# Patient Record
Sex: Female | Born: 1985 | Race: Black or African American | Hispanic: No | Marital: Single | Smoking: Former smoker
Health system: Southern US, Community
[De-identification: ages and names within clinical notes are randomized; demographics above are authoritative.]

## PROBLEM LIST (undated history)

## (undated) DIAGNOSIS — J45909 Unspecified asthma, uncomplicated: Secondary | ICD-10-CM

## (undated) DIAGNOSIS — J040 Acute laryngitis: Secondary | ICD-10-CM

## (undated) HISTORY — PX: TUBAL LIGATION: SHX77

---

## 2007-06-20 ENCOUNTER — Emergency Department: Payer: Self-pay | Admitting: Emergency Medicine

## 2007-06-25 ENCOUNTER — Emergency Department: Payer: Self-pay | Admitting: Emergency Medicine

## 2007-07-20 ENCOUNTER — Emergency Department: Payer: Self-pay | Admitting: Emergency Medicine

## 2008-01-03 ENCOUNTER — Observation Stay: Payer: Self-pay | Admitting: Unknown Physician Specialty

## 2008-02-01 ENCOUNTER — Ambulatory Visit: Payer: Self-pay | Admitting: Obstetrics & Gynecology

## 2008-02-02 ENCOUNTER — Inpatient Hospital Stay: Payer: Self-pay | Admitting: Obstetrics & Gynecology

## 2008-04-18 ENCOUNTER — Ambulatory Visit: Payer: Self-pay | Admitting: Family Medicine

## 2011-09-02 ENCOUNTER — Ambulatory Visit: Payer: Self-pay

## 2012-12-20 ENCOUNTER — Ambulatory Visit: Payer: Self-pay | Admitting: Obstetrics and Gynecology

## 2013-02-14 ENCOUNTER — Observation Stay: Payer: Self-pay | Admitting: Obstetrics and Gynecology

## 2013-02-14 LAB — URINALYSIS, COMPLETE
Bilirubin,UR: NEGATIVE
Blood: NEGATIVE
Leukocyte Esterase: NEGATIVE
Nitrite: NEGATIVE
Ph: 7 (ref 4.5–8.0)
Protein: NEGATIVE
WBC UR: 1 /HPF (ref 0–5)

## 2013-03-23 ENCOUNTER — Observation Stay: Payer: Self-pay | Admitting: Obstetrics and Gynecology

## 2013-03-23 LAB — URINALYSIS, COMPLETE
Bilirubin,UR: NEGATIVE
Glucose,UR: NEGATIVE mg/dL (ref 0–75)
Ketone: NEGATIVE
Leukocyte Esterase: NEGATIVE
Ph: 8 (ref 4.5–8.0)
RBC,UR: 1 /HPF (ref 0–5)

## 2013-04-04 ENCOUNTER — Inpatient Hospital Stay: Payer: Self-pay | Admitting: Internal Medicine

## 2013-04-04 LAB — CBC WITH DIFFERENTIAL/PLATELET
Eosinophil %: 0.6 %
HCT: 36.9 % (ref 35.0–47.0)
HGB: 12.5 g/dL (ref 12.0–16.0)
Monocyte %: 5 %
Neutrophil %: 76.2 %
RBC: 4.66 10*6/uL (ref 3.80–5.20)
RDW: 14.1 % (ref 11.5–14.5)
WBC: 10.5 10*3/uL (ref 3.6–11.0)

## 2013-04-06 LAB — PATHOLOGY REPORT

## 2014-07-19 ENCOUNTER — Ambulatory Visit: Payer: Self-pay | Admitting: Internal Medicine

## 2014-07-26 ENCOUNTER — Ambulatory Visit: Payer: Self-pay

## 2014-08-09 ENCOUNTER — Ambulatory Visit: Payer: Self-pay | Admitting: Physician Assistant

## 2014-11-02 NOTE — Op Note (Signed)
PATIENT NAME:  Margaret Jennings, Margaret Jennings MR#:  [id number] DATE OF BIRTH:  Jan 16, 1986  DATE OF PROCEDURE:  04/04/2013  PREOPERATIVE DIAGNOSIS:  1.  Prior cesarean section.  2.  Spontaneous rupture of membranes  POSTOPERATIVE DIAGNOSIS:  1.  Prior cesarean section.  2.  Spontaneous rupture of membranes  PROCEDURE PERFORMED: Repeat low transverse cesarean section and bilateral partial salpingectomy.   ANESTHESIA:  Spinal.   SURGEON: Senaida LangeLashawn Weaver Lee, M.D.   ASSISTANT: Tammy Brothers.   ESTIMATED BLOOD LOSS: 500 mL    COMPLICATIONS: None.   FINDINGS: Vertex female infant, 3240 grams, Apgars 9 and 9, very thin lower uterine segment with minimal adhesions, normal tubes and ovaries.   SPECIMENS: Portion of right and left tube.   INDICATIONS: The patient is a 29 year old G3, P2 with a history of two prior cesarean sections who presents with spontaneous rupture of membranes. The decision was made to proceed to delivery by a repeat C-section. The patient also desired sterility and tubal ligation. Medicaid permission papers were signed. Risks, benefits and alternatives of the procedure were explained and informed consent was obtained.   PROCEDURE: The patient was taken to the operating room with IV fluids running. She was prepped and draped in the usual sterile fashion with a leftward tilt. A Pfannenstiel skin incision was made, carried down to the fascia with the knife. The fascia was nicked in the midline. The incision was extended laterally. The superior aspect of the fascia was grasped with Kocher clamps and the underlying rectus muscle dissected off sharply using the knife and also using curved Mayo scissors. This was repeated on the inferior fascia. This was done with some difficulty secondary to scar tissue. An opening had been made posteriorly on the fascia and the opening was extended using Metzenbaum scissors. The bladder blade was placed and the vesicouterine peritoneum was grasped with  some difficulty secondary to adhesions from prior cesarean section. The bladder flap was created as best as could be done. The hysterotomy was made bluntly with the surgeon'Jennings fingers as the lower uterine segment was very, very thin. The opening was then extended and the infant'Jennings head was grasped. The membranes were ruptured with an Allis. The infant'Jennings head was delivered atraumatically through the hysterotomy incision. The mouth and nose were bulb suctioned. The anterior and posterior shoulders were delivered followed by the remainder of the body. The cord was clamped x 2 and cut. The infant was handed to the awaiting nursery staff. The placenta was expressed. The uterus was exteriorized and cleared of all clot and debris. The hysterotomy incision was repaired with #0 Monocryl in a running locked fashion. Attention was turned to the patient'Jennings right tube where it was grasped with a Babcock and an opening made in the mesosalpinx with the Bovie cautery. Two pieces of plain gut suture were passed through the opening and the tube was tied 2 to 3 cm lateral to the uterine cornu. This was repeated on the patient'Jennings left tube. The tube was cut. The cut edges were made hemostatic. The uterus was returned to the abdomen. The abdomen and gutters were irrigated with copious amounts of warm normal saline. The rectus muscles were reapproximated with 1 figure-of-eight stitch. The On-Q apparatus was placed according to manufacturer'Jennings instructions. The fascia was closed with a #1 PDS and the skin was closed with 4-0 Vicryl. Each On-Q catheter was bolused with 5 mL 0.5% bupivacaine plain. The incision was covered with benzoin and Steri-Strips. The catheters were secured to the  patient'Jennings abdomen using Steri-Strips and Tegaderm. The patient tolerated the procedure well. Sponge, needle and instrument counts were correct x 2. The patient was taken to the recovery room in stable condition.    ____________________________ Sonda Primes  Patton Salles, MD law:cc D: 04/04/2013 19:03:04 ET T: 04/04/2013 19:44:35 ET JOB#: [id number]  cc: Flint Melter A. Patton Salles, MD, <Dictator> Janyth Contes LEE MD ELECTRONICALLY SIGNED 04/06/2013 15:19

## 2014-11-20 NOTE — H&P (Signed)
L&D Evaluation:  History:  HPI 29 yo G3P2002 at 5635w4d by EDC of 04/09/2013 presenting for contractions that started this afternoon, no LOF, no VB, +FM.  History of prior C-section.  Pregnancy otherwise uncomplicated.   Presents with contractions   Patient's Medical History obesity   Patient's Surgical History Previous C-Section   Medications Pre Natal Vitamins  Iron   Allergies NKDA   Social History none   Family History Non-Contributory   ROS:  ROS All systems were reviewed.  HEENT, CNS, GI, GU, Respiratory, CV, Renal and Musculoskeletal systems were found to be normal.   Exam:  Vital Signs stable   Urine Protein not completed   General no apparent distress   Mental Status clear   Abdomen gravid, non-tender   Estimated Fetal Weight Average for gestational age   Fetal Position vtx   Edema no edema   Pelvic no external lesions, cervix closed and thick   Mebranes Intact   FHT normal rate with no decels, reactive NST (actually negative contraction stress test)   Ucx regular, q385min   Impression:  Impression Braxton Hick contractions at 7635w4d   Plan:  Plan EFM/NST, monitor contractions and for cervical change   Comments - No cervical change on recheck, discussed braxton hick contractions and hydration.  Discussed that unless she is actively changing her cervix and contracting she would not be delivered before 39 weeks - Follow up in place tomorrow with myself   Follow Up Appointment already scheduled. 9/12   Electronic Signatures: Lorrene ReidStaebler, Guiseppe Flanagan M (MD)  (Signed 11-Sep-14 16:06)  Authored: L&D Evaluation   Last Updated: 11-Sep-14 16:06 by Lorrene ReidStaebler, Haydee Jabbour M (MD)

## 2014-11-20 NOTE — H&P (Signed)
L&D Evaluation:  History Expanded:  HPI 29 yo G3P2002 at 518w6d, with EDD of 04/09/2013 per LMP & 9 wk US, presents with SROM at 0710 this am. Having mild contractions, no VB, +FM. History of prior C-section x2.  Pregnancy otherwise uncomplicated. Desires BTL.   Blood Type (Maternal) O positive   Group B Strep Results Maternal (Result >5wks must be treated as unknown) positive   Maternal HIV Negative   Maternal Syphilis Ab Nonreactive   Maternal Varicella Immune   Rubella Results (Maternal) immune   Presents with leaking fluid   Patient's Medical History obesity   Patient's Surgical History Previous C-Section  x2   Medications Pre Natal Vitamins  Iron  Prilosec   Allergies NKDA   Social History none   Family History Non-Contributory   ROS:  ROS All systems were reviewed.  HEENT, CNS, GI, GU, Respiratory, CV, Renal and Musculoskeletal systems were found to be normal.   Exam:  Vital Signs stable   Urine Protein not completed   General no apparent distress   Mental Status clear   Chest clear   Heart no murmur/gallop/rubs   Abdomen gravid, non-tender   Estimated Fetal Weight Average for gestational age   Edema no edema   Pelvic FT per RN,   Mebranes Ruptured, grossly ruptured, + nitrizine   FHT baseline 150 bpm, min-mod variability   Ucx regular, q 2-6 min   Impression:  Impression SROM at 38.6 wks, previous c-section   Plan:  Comments Plan for repeat C-section with BTL today, Dr Patton SallesWeaver-Lee made aware.   Electronic Signatures: Margaret Jennings, Margaret Jennings (CNM)  (Signed 23-Sep-14 09:17)  Authored: L&D Evaluation   Last Updated: 23-Sep-14 09:17 by Vella KohlerBrothers, Anne [place] Jennings (CNM)

## 2014-11-20 NOTE — H&P (Signed)
L&D Evaluation:  History:  HPI 29 [facility] G3P2002 [facility] presenting for loss of mucous plug.  No ctx, no LOF, no VB, +FM.  History of prior C-section.  Pregnancy otherwise uncomplicated.   Presents with lost mucous plug   Patient's Medical History obesity   Patient's Surgical History Previous C-Section   Medications Pre [place]atal Vitamins  Iron   Allergies NKDA   Social History none   Family History Non-Contributory   ROS:  ROS All systems were reviewed.  HEENT, CNS, GI, GU, Respiratory, CV, Renal and Musculoskeletal systems were found to be normal.   Exam:  Vital Signs stable   Urine Protein not completed   General no apparent distress   Mental Status clear   Abdomen gravid, non-tender   Estimated Fetal Weight Average for gestational age   Edema no edema   Pelvic no external lesions, cervix closed and thick   Mebranes Intact, nitrazine negative   FHT normal rate with no decels, reacitve NST by 32 week criteria   Ucx irregular, patient is having some irregular contractions so kept for 2-hr, she doees not feel these and no cervical changed noted on exam, FFN not sent secondary to closed unchanged cervix   Impression:  Impression R/O rupture of membranes   Plan:  Plan EFM/NST, monitor contractions and for cervical change   Comments - note to be taken out of work at 34 weeks.  She has seen me multiple times this pregnancy with incrasing discomforts of pregnancy noted at work.  Is on her feet a lot.  - no cervical change, routine preterm labor contractions   Follow Up Appointment already scheduled. 02/23/13   Electronic Signatures: Lorrene ReidStaebler, Rhyli Depaula M (MD)  (Signed 05-Aug-14 20:20)  Authored: L&D Evaluation   Last Updated: 05-Aug-14 20:20 by Lorrene ReidStaebler, Bernard Donahoo M (MD)

## 2015-07-05 ENCOUNTER — Encounter: Payer: Self-pay | Admitting: Obstetrics & Gynecology

## 2015-07-05 ENCOUNTER — Ambulatory Visit (INDEPENDENT_AMBULATORY_CARE_PROVIDER_SITE_OTHER): Payer: Medicaid Other | Admitting: Obstetrics & Gynecology

## 2015-07-05 VITALS — BP 118/74 | Ht 69.0 in | Wt 202.0 lb

## 2015-07-05 DIAGNOSIS — N921 Excessive and frequent menstruation with irregular cycle: Secondary | ICD-10-CM

## 2015-07-05 MED ORDER — LEVONORGEST-ETH ESTRAD 91-DAY 0.15-0.03 &0.01 MG PO TABS: 1.0000 | ORAL_TABLET | Freq: Every day | ORAL | Status: DC

## 2015-07-05 NOTE — Progress Notes (Signed)
Patient ID: Margaret Jennings, female   DOB: 1986-04-16, 29 y.o.   MRN: [id number]      Chief Complaint  Patient presents with  . Menorrhagia    Blood pressure 118/74, height 5\' 9"  (1.753 m), weight 202 lb (91.627 kg), last menstrual period 06/30/2015.  29 y.o. No obstetric history on file. Patient's last menstrual period was 06/30/2015. The current method of family planning is none.  Subjective Irregular periods last 5-7 days cramps small clots  Objective   Pertinent ROS   Labs or studies     Impression Diagnoses this Encounter::   ICD-9-CM ICD-10-CM   1. Menorrhagia with irregular cycle 626.2 N92.1     Established relevant diagnosis(es):   Plan/Recommendations: Meds ordered this encounter  Medications  . Levonorgestrel-Ethinyl Estradiol (AMETHIA,CAMRESE) 0.15-0.03 &0.01 MG tablet    Sig: Take 1 tablet by mouth daily.    Dispense:  1 Package    Refill:  4    Labs or Scans Ordered: No orders of the defined types were placed in this encounter.    Management::   Follow up  Return in about 6 weeks (around 08/16/2015) for yearly, with Dr Despina HiddenEure.        Face to face time:  20 minutes  Greater than 50% of the visit time was spent in counseling and coordination of care with the patient.  The summary and outline of the counseling and care coordination is summarized in the note above.   All questions were answered.  History reviewed. No pertinent past medical history.  Past Surgical History  Procedure Laterality Date  . Tubal ligation    . Cesarean section  40,98,1104,09,14    OB History    No data available      No Known Allergies  Social History   Social History  . Marital Status: Single    Spouse Name: N/A  . Number of Children: N/A  . Years of Education: N/A   Social History Main Topics  . Smoking status: Former Games developermoker  . Smokeless tobacco: Never Used  . Alcohol Use: Yes     Comment: occ.  . Drug Use: No  . Sexual Activity: Yes   Birth Control/ Protection: Surgical   Other Topics Concern  . None   Social History Narrative  . None    History reviewed. No pertinent family history.

## 2015-08-16 ENCOUNTER — Other Ambulatory Visit: Payer: Medicaid Other | Admitting: Obstetrics & Gynecology

## 2015-08-27 ENCOUNTER — Telehealth: Payer: Self-pay | Admitting: Obstetrics & Gynecology

## 2015-08-27 MED ORDER — LEVONORGEST-ETH ESTRAD 91-DAY 0.15-0.03 &0.01 MG PO TABS: 1.0000 | ORAL_TABLET | Freq: Every day | ORAL | Status: DC

## 2015-08-27 NOTE — Telephone Encounter (Signed)
Thanks and noted 

## 2015-08-27 NOTE — Telephone Encounter (Signed)
Pt requesting new Rx for BCP - Camrese e-scribed to [employer] [facility].

## 2015-09-06 ENCOUNTER — Other Ambulatory Visit: Payer: Medicaid Other | Admitting: Obstetrics & Gynecology

## 2017-03-01 ENCOUNTER — Other Ambulatory Visit: Payer: Self-pay

## 2017-03-01 ENCOUNTER — Emergency Department
Admission: EM | Admit: 2017-03-01 | Discharge: 2017-03-01 | Disposition: A | Discharge status: Home / Self Care | Payer: Self-pay | Attending: Student in an Organized Health Care Education/Training Program | Admitting: Student in an Organized Health Care Education/Training Program

## 2017-03-01 ENCOUNTER — Emergency Department: Payer: Self-pay

## 2017-03-01 DIAGNOSIS — Z87891 Personal history of nicotine dependence: Secondary | ICD-10-CM | POA: Insufficient documentation

## 2017-03-01 DIAGNOSIS — F410 Panic disorder [episodic paroxysmal anxiety] without agoraphobia: Secondary | ICD-10-CM | POA: Insufficient documentation

## 2017-03-01 DIAGNOSIS — J45909 Unspecified asthma, uncomplicated: Secondary | ICD-10-CM | POA: Insufficient documentation

## 2017-03-01 DIAGNOSIS — R0602 Shortness of breath: Secondary | ICD-10-CM | POA: Insufficient documentation

## 2017-03-01 DIAGNOSIS — R079 Chest pain, unspecified: Secondary | ICD-10-CM | POA: Insufficient documentation

## 2017-03-01 DIAGNOSIS — R002 Palpitations: Secondary | ICD-10-CM | POA: Insufficient documentation

## 2017-03-01 DIAGNOSIS — R1111 Vomiting without nausea: Secondary | ICD-10-CM | POA: Insufficient documentation

## 2017-03-01 HISTORY — DX: Unspecified asthma, uncomplicated: J45.909

## 2017-03-01 HISTORY — DX: Acute laryngitis: J04.0

## 2017-03-01 LAB — BASIC METABOLIC PANEL
ANION GAP: 9 (ref 5–15)
BUN: 11 mg/dL (ref 6–20)
CO2: 26 mmol/L (ref 22–32)
Calcium: 9 mg/dL (ref 8.9–10.3)
Chloride: 104 mmol/L (ref 101–111)
Creatinine, Ser: 1.12 mg/dL — ABNORMAL HIGH (ref 0.44–1.00)
GFR calc Af Amer: >60 mL/min (ref >60)
Glucose, Bld: 105 mg/dL — ABNORMAL HIGH (ref 65–99)
POTASSIUM: 3.5 mmol/L (ref 3.5–5.1)
SODIUM: 139 mmol/L (ref 135–145)

## 2017-03-01 LAB — CBC
HEMATOCRIT: 40.3 % (ref 35.0–47.0)
Hemoglobin: 13.6 g/dL (ref 12.0–16.0)
MCH: 27.8 pg (ref 26.0–34.0)
MCHC: 33.7 g/dL (ref 32.0–36.0)
MCV: 82.4 fL (ref 80.0–100.0)
Platelets: 218 10*3/uL (ref 150–440)
RBC: 4.89 MIL/uL (ref 3.80–5.20)
RDW: 12.4 % (ref 11.5–14.5)
WBC: 8.3 10*3/uL (ref 3.6–11.0)

## 2017-03-01 LAB — HCG, QUANTITATIVE, PREGNANCY

## 2017-03-01 LAB — TROPONIN I: Troponin I: <0.03 ng/mL (ref <0.03)

## 2017-03-01 MED ORDER — LORAZEPAM 1 MG PO TABS
1.0000 mg | ORAL_TABLET | Freq: Once | ORAL | Status: AC
  Administered 2017-03-01: 1 mg via ORAL
  Filled 2017-03-01: qty 1

## 2017-03-01 MED ORDER — LORAZEPAM 0.5 MG PO TABS: 0.5000 mg | ORAL_TABLET | Freq: Three times a day (TID) | ORAL | 0 refills | Status: AC | PRN

## 2017-03-01 NOTE — ED Notes (Signed)
Pt on phone at this time 

## 2017-03-01 NOTE — ED Provider Notes (Signed)
[facility] Emergency Department Provider Note    First MD Initiated Contact with Patient 03/01/17 1355     (approximate)  I have reviewed the triage vital signs and the nursing notes.   HISTORY  Chief Complaint Chest Pain and Anxiety    HPI Margaret Jennings is a 31 y.o. female resents with a chief complaint of chest pain palpitations shortness of breath nausea and jitteriness that awoke her from sleep at 1 AM. Patient states she felt squeezing pressure in her chest became very tearful and nervous. Cannot sleep any progressive tonight. Has never had symptoms like this before. Came to the ER due to persistent episodes and shortness of breath. Does have a history of asthma but has not had any wheezing or shortness of breath until last night. No previous history of heart attacks. She's not on any birth control. No history of DVTs. States that she has been stressed out at work and with family.   Past Medical History:  Diagnosis Date  . Asthma   . Laryngitis    No family history on file. Past Surgical History:  Procedure Laterality Date  . CESAREAN SECTION  [id number]  . TUBAL LIGATION     There are no active problems to display for this patient.     Prior to Admission medications   Medication Sig Start Date End Date Taking? Authorizing Provider  ibuprofen (ADVIL,MOTRIN) 200 MG tablet Take 800 mg by mouth every 6 (six) hours as needed for cramping.   Yes [provider]  Levonorgestrel-Ethinyl Estradiol (AMETHIA,CAMRESE) 0.15-0.03 &0.01 MG tablet Take 1 tablet by mouth daily. 08/27/15   Lazaro Arms, MD    Allergies Patient has no known allergies.    Social History Social History  Substance Use Topics  . Smoking status: Former Games developer  . Smokeless tobacco: Never Used  . Alcohol use Yes     Comment: occ.    Review of Systems Patient denies headaches, rhinorrhea, blurry vision, numbness, shortness of breath, chest pain, edema,  cough, abdominal pain, nausea, vomiting, diarrhea, dysuria, fevers, rashes or hallucinations unless otherwise stated above in HPI. ____________________________________________   PHYSICAL EXAM:  VITAL SIGNS: Vitals:   03/01/17 1018  BP: (!) 134/104  Pulse: 63  Resp: 18  Temp: 97.8 F (36.6 C)  SpO2: 100%    Constitutional: Alert and oriented. Well appearing and in no acute distress. Eyes: Conjunctivae are normal.  Head: Atraumatic. Nose: No congestion/rhinnorhea. Mouth/Throat: Mucous membranes are moist.   Neck: No stridor. Painless ROM.  Cardiovascular: Normal rate, regular rhythm. Grossly normal heart sounds.  Good peripheral circulation. Respiratory: Normal respiratory effort.  No retractions. Lungs CTAB. Gastrointestinal: Soft and nontender. No distention. No abdominal bruits. No CVA tenderness. Genitourinary:  Musculoskeletal: No lower extremity tenderness nor edema.  No joint effusions. Neurologic:  Normal speech and language. No gross focal neurologic deficits are appreciated. No facial droop Skin:  Skin is warm, dry and intact. No rash noted. Psychiatric: anxious appearing, organized thought process  ____________________________________________   LABS (all labs ordered are listed, but only abnormal results are displayed)  Results for orders placed or performed during the hospital encounter of 03/01/17 (from the past 24 hour(s))  Basic metabolic panel     Status: Abnormal   Collection Time: 03/01/17 10:15 AM  Result Value Ref Range   Sodium 139 135 - 145 mmol/L   Potassium 3.5 3.5 - 5.1 mmol/L   Chloride 104 101 - 111 mmol/L   CO2 26 22 -  32 mmol/L   Glucose, Bld 105 (H) 65 - 99 mg/dL   BUN 11 6 - 20 mg/dL   Creatinine, Ser 1.61 (H) 0.44 - 1.00 mg/dL   Calcium 9.0 8.9 - 09.6 mg/dL   GFR calc non Af Amer >60 >60 mL/min   GFR calc Af Amer >60 >60 mL/min   Anion gap 9 5 - 15  CBC     Status: None   Collection Time: 03/01/17 10:15 AM  Result Value Ref Range    WBC 8.3 3.6 - 11.0 K/uL   RBC 4.89 3.80 - 5.20 MIL/uL   Hemoglobin 13.6 12.0 - 16.0 g/dL   HCT 04.5 40.9 - 81.1 %   MCV 82.4 80.0 - 100.0 fL   MCH 27.8 26.0 - 34.0 pg   MCHC 33.7 32.0 - 36.0 g/dL   RDW 91.4 78.2 - 95.6 %   Platelets 218 150 - 440 K/uL  Troponin I     Status: None   Collection Time: 03/01/17 10:15 AM  Result Value Ref Range   Troponin I <0.03 <0.03 ng/mL   ____________________________________________  EKG My review and personal interpretation at Time: 10:15   Indication: chest pain  Rate: 60  Rhythm: sinus Axis: normal Other: no stemi, non specific st change, no wpw, no brugada ____________________________________________  RADIOLOGY  I personally reviewed all radiographic images ordered to evaluate for the above acute complaints and reviewed radiology reports and findings.  These findings were personally discussed with the patient.  Please see medical record for radiology report.  ____________________________________________   PROCEDURES  Procedure(s) performed:  Procedures    Critical Care performed: no ____________________________________________   INITIAL IMPRESSION / ASSESSMENT AND PLAN / ED COURSE  Pertinent labs & imaging results that were available during my care of the patient were reviewed by me and considered in my medical decision making (see chart for details).  DDX: ACS, pericarditis, esophagitis, boerhaaves, pe, dissection, pna, bronchitis, costochondritis   Margaret Jennings is a 31 y.o. who presents to the ED with chest pain shortness of breath and tingling in jitteriness as described above. EKG shows no evidence of acute ischemia and her troponin is negative. This is not clinically consistent with ACS. She is no evidence of pneumothorax. No mediastinal widening. This is not consistent with dissection. Patient is low risk by well's criteria and is PERC negative.  Her abdominal exam is soft and benign. Blood work is reassuring. Do feel  that her symptoms are more likely secondary to panic attack. She has no evidence of asthma exacerbation.  Have discussed with the patient and available family all diagnostics and treatments performed thus far and all questions were answered to the best of my ability. The patient demonstrates understanding and agreement with plan.       ____________________________________________   FINAL CLINICAL IMPRESSION(S) / ED DIAGNOSES  Final diagnoses:  Chest pain, unspecified type  Panic attack      NEW MEDICATIONS STARTED DURING THIS VISIT:  New Prescriptions   No medications on file     Note:  This document was prepared using Dragon voice recognition software and may include unintentional dictation errors.    Willy Eddy, MD 03/01/17 [id number]

## 2017-03-01 NOTE — ED Notes (Signed)
Pt states discharge understanding and RX understanding, friends at bedside for ride, signature pad not working in room

## 2017-03-01 NOTE — ED Notes (Signed)
Patient transported to X-ray 

## 2017-03-01 NOTE — ED Triage Notes (Signed)
Pt arrives to ER via [facility] from friend's house after experieincing chest pain that awoke her at 1AM. Pt appears anxious, tearful. Pt states that she feels "weird". Pt alert and oriented X4, active, cooperative, pt in NAD. RR even and unlabored, color WNL.

## 2017-03-01 NOTE — ED Notes (Signed)
EDP at bedside  

## 2017-11-30 ENCOUNTER — Emergency Department (HOSPITAL_COMMUNITY)
Admission: EM | Admit: 2017-11-30 | Discharge: 2017-11-30 | Disposition: A | Discharge status: Home / Self Care | Payer: Self-pay | Attending: Emergency Medicine | Admitting: Emergency Medicine

## 2017-11-30 ENCOUNTER — Encounter (HOSPITAL_COMMUNITY): Payer: Self-pay | Admitting: Emergency Medicine

## 2017-11-30 ENCOUNTER — Other Ambulatory Visit: Payer: Self-pay

## 2017-11-30 DIAGNOSIS — Z87891 Personal history of nicotine dependence: Secondary | ICD-10-CM | POA: Insufficient documentation

## 2017-11-30 DIAGNOSIS — B349 Viral infection, unspecified: Secondary | ICD-10-CM | POA: Insufficient documentation

## 2017-11-30 DIAGNOSIS — J029 Acute pharyngitis, unspecified: Secondary | ICD-10-CM | POA: Insufficient documentation

## 2017-11-30 DIAGNOSIS — Z79899 Other long term (current) drug therapy: Secondary | ICD-10-CM | POA: Insufficient documentation

## 2017-11-30 DIAGNOSIS — J45909 Unspecified asthma, uncomplicated: Secondary | ICD-10-CM | POA: Insufficient documentation

## 2017-11-30 LAB — GROUP A STREP BY PCR: Group A Strep by PCR: NOT DETECTED

## 2017-11-30 MED ORDER — OXYMETAZOLINE HCL 0.05 % NA SOLN
1.0000 | Freq: Once | NASAL | Status: AC
  Administered 2017-11-30: 1 via NASAL
  Filled 2017-11-30: qty 15

## 2017-11-30 MED ORDER — ACETAMINOPHEN 325 MG PO TABS
650.0000 mg | ORAL_TABLET | Freq: Once | ORAL | Status: AC
  Administered 2017-11-30: 650 mg via ORAL
  Filled 2017-11-30: qty 2

## 2017-11-30 MED ORDER — IBUPROFEN 800 MG PO TABS
800.0000 mg | ORAL_TABLET | Freq: Once | ORAL | Status: AC
  Administered 2017-11-30: 800 mg via ORAL
  Filled 2017-11-30: qty 1

## 2017-11-30 NOTE — ED Triage Notes (Signed)
Pt c/o of sore throat, cough, n/v since yesterday. Denies fever.

## 2017-11-30 NOTE — ED Provider Notes (Signed)
[facility] EMERGENCY DEPARTMENT Provider Note   CSN: [id number] Arrival date & time: 11/30/17  4098     History   Chief Complaint Chief Complaint  Patient presents with  . Sore Throat    HPI Margaret Jennings is a 32 y.o. female.  HPI  32 yo female with a 5 day ho of body aches, f/b nasal congestion, cough, post tussive emesis, nausea and loose bowel movements.  No fever, dyspnea, or abdominal pain.  No known sick contacts.  States some congestion c.w. Seasonal allergies.  Sore throat began yesterday.  Patient swallowing and phonating and breathing without difficulty.     Past Medical History:  Diagnosis Date  . Asthma   . Laryngitis     There are no active problems to display for this patient.   Past Surgical History:  Procedure Laterality Date  . CESAREAN SECTION  [id number]  . TUBAL LIGATION       OB History   None      Home Medications    Prior to Admission medications   Medication Sig Start Date End Date Taking? Authorizing Provider  ibuprofen (ADVIL,MOTRIN) 200 MG tablet Take 800 mg by mouth every 6 (six) hours as needed for cramping.    [provider]  Levonorgestrel-Ethinyl Estradiol (AMETHIA,CAMRESE) 0.15-0.03 &0.01 MG tablet Take 1 tablet by mouth daily. 08/27/15   Lazaro Arms, MD  LORazepam (ATIVAN) 0.5 MG tablet Take 1 tablet (0.5 mg total) by mouth every 8 (eight) hours as needed for anxiety. 03/01/17 03/01/18  Willy Eddy, MD    Family History No family history on file.  Social History Social History   Tobacco Use  . Smoking status: Former Games developer  . Smokeless tobacco: Never Used  Substance Use Topics  . Alcohol use: Yes    Comment: occ.  . Drug use: No     Allergies   Patient has no known allergies.   Review of Systems Review of Systems  All other systems reviewed and are negative.    Physical Exam Updated Vital Signs BP (!) 138/91 (BP Location: Right Arm)   Pulse 78   Temp 98.1 F (36.7 C) (Oral)    Resp 18   Ht 1.753 m ( )   Wt 89.8 kg (198 lb)   SpO2 100%   BMI 29.24 kg/m   Physical Exam  Constitutional: She appears well-developed and well-nourished.  HENT:  Head: Normocephalic and atraumatic.  Right Ear: Hearing, tympanic membrane and ear canal normal. No drainage or swelling.  Left Ear: Hearing, tympanic membrane and ear canal normal. No drainage or swelling.  Mouth/Throat: Uvula is midline and mucous membranes are normal. No uvula swelling. Posterior oropharyngeal erythema present. No oropharyngeal exudate, posterior oropharyngeal edema or tonsillar abscesses.  Eyes: Pupils are equal, round, and reactive to light. EOM are normal.  Neck: Normal range of motion. Neck supple.  Cardiovascular: Normal rate and regular rhythm.  Abdominal: Bowel sounds are normal.  Neurological: She is alert.  Skin: Skin is warm and dry. Capillary refill takes less than 2 seconds.  Psychiatric: She has a normal mood and affect.  Nursing note and vitals reviewed.    ED Treatments / Results  Labs (all labs ordered are listed, but only abnormal results are displayed) Labs Reviewed  GROUP A STREP BY PCR    EKG None  Radiology No results found.  Procedures Procedures (including critical care time)  Medications Ordered in ED Medications - No data to display   Initial Impression /  Assessment and Plan / ED Course  I have reviewed the triage vital signs and the nursing notes.  Pertinent labs & imaging results that were available during my care of the patient were reviewed by me and considered in my medical decision making (see chart for details).     Strep pending   Final Clinical Impressions(s) / ED Diagnoses   Final diagnoses:  None    ED Discharge Orders    None       Margarita Grizzle, MD 11/30/17 1558

## 2018-02-03 ENCOUNTER — Emergency Department (HOSPITAL_COMMUNITY): Payer: Self-pay

## 2018-02-03 ENCOUNTER — Other Ambulatory Visit: Payer: Self-pay

## 2018-02-03 ENCOUNTER — Encounter (HOSPITAL_COMMUNITY): Payer: Self-pay

## 2018-02-03 ENCOUNTER — Emergency Department (HOSPITAL_COMMUNITY)
Admission: EM | Admit: 2018-02-03 | Discharge: 2018-02-03 | Disposition: A | Discharge status: Home / Self Care | Payer: Self-pay | Attending: Emergency Medicine | Admitting: Emergency Medicine

## 2018-02-03 DIAGNOSIS — Y929 Unspecified place or not applicable: Secondary | ICD-10-CM | POA: Insufficient documentation

## 2018-02-03 DIAGNOSIS — R0781 Pleurodynia: Secondary | ICD-10-CM

## 2018-02-03 DIAGNOSIS — J45909 Unspecified asthma, uncomplicated: Secondary | ICD-10-CM | POA: Insufficient documentation

## 2018-02-03 DIAGNOSIS — S20419A Abrasion of unspecified back wall of thorax, initial encounter: Secondary | ICD-10-CM | POA: Insufficient documentation

## 2018-02-03 DIAGNOSIS — Y999 Unspecified external cause status: Secondary | ICD-10-CM | POA: Insufficient documentation

## 2018-02-03 DIAGNOSIS — R0789 Other chest pain: Secondary | ICD-10-CM | POA: Insufficient documentation

## 2018-02-03 DIAGNOSIS — T148XXA Other injury of unspecified body region, initial encounter: Secondary | ICD-10-CM

## 2018-02-03 DIAGNOSIS — S5332XA Traumatic rupture of left ulnar collateral ligament, initial encounter: Secondary | ICD-10-CM

## 2018-02-03 DIAGNOSIS — Z87891 Personal history of nicotine dependence: Secondary | ICD-10-CM | POA: Insufficient documentation

## 2018-02-03 DIAGNOSIS — S63642A Sprain of metacarpophalangeal joint of left thumb, initial encounter: Secondary | ICD-10-CM | POA: Insufficient documentation

## 2018-02-03 DIAGNOSIS — Y939 Activity, unspecified: Secondary | ICD-10-CM | POA: Insufficient documentation

## 2018-02-03 MED ORDER — IBUPROFEN 600 MG PO TABS: 600.0000 mg | ORAL_TABLET | Freq: Four times a day (QID) | ORAL | 0 refills | Status: DC | PRN

## 2018-02-03 MED ORDER — HYDROCODONE-ACETAMINOPHEN 5-325 MG PO TABS
1.0000 | ORAL_TABLET | Freq: Once | ORAL | Status: AC
  Administered 2018-02-03: 1 via ORAL
  Filled 2018-02-03: qty 1

## 2018-02-03 MED ORDER — HYDROCODONE-ACETAMINOPHEN 5-325 MG PO TABS: 1.0000 | ORAL_TABLET | Freq: Four times a day (QID) | ORAL | 0 refills | Status: DC | PRN

## 2018-02-03 MED ORDER — IBUPROFEN 400 MG PO TABS
600.0000 mg | ORAL_TABLET | Freq: Once | ORAL | Status: AC
Start: 2018-02-03 — End: 2018-02-03
  Administered 2018-02-03: 600 mg via ORAL
  Filled 2018-02-03: qty 2
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
## 2018-02-03 NOTE — ED Notes (Signed)
RT notified for incentive spirometer.  
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
## 2018-02-03 NOTE — Discharge Instructions (Signed)
Your x-ray showed no evidence of fracture.  Please use a wrist splint to help support your left thumb, ibuprofen, ice and elevation.  X-rays of your chest showed no evidence of fracture, you likely have bruising here, sometimes a hairline fracture can still be present, the best thing for this is pain control and using the incentive spirometer about 3 times a day to help prevent any pneumonia.  Take ibuprofen every 6 hours, and Norco as needed for breakthrough pain, Norco can cause drowsiness do not take before driving or operating machinery.  Follow-up with your primary care doctor, return to the emergency department for significantly worsened pain, fevers, productive cough or any other new or concerning symptoms.

## 2018-07-10 ENCOUNTER — Other Ambulatory Visit: Payer: Self-pay

## 2018-07-10 ENCOUNTER — Encounter (HOSPITAL_COMMUNITY): Payer: Self-pay

## 2018-07-10 ENCOUNTER — Emergency Department (HOSPITAL_COMMUNITY)
Admission: EM | Admit: 2018-07-10 | Discharge: 2018-07-11 | Disposition: A | Discharge status: Home / Self Care | Payer: Medicaid Other | Attending: Emergency Medicine | Admitting: Emergency Medicine

## 2018-07-10 DIAGNOSIS — J111 Influenza due to unidentified influenza virus with other respiratory manifestations: Secondary | ICD-10-CM

## 2018-07-10 DIAGNOSIS — J4521 Mild intermittent asthma with (acute) exacerbation: Secondary | ICD-10-CM | POA: Insufficient documentation

## 2018-07-10 LAB — GROUP A STREP BY PCR: Group A Strep by PCR: NOT DETECTED

## 2018-07-10 MED ORDER — METOCLOPRAMIDE HCL 5 MG/ML IJ SOLN
10.0000 mg | Freq: Once | INTRAMUSCULAR | Status: AC
  Administered 2018-07-11: 10 mg via INTRAMUSCULAR
  Filled 2018-07-10: qty 2

## 2018-07-10 MED ORDER — DIPHENHYDRAMINE HCL 50 MG/ML IJ SOLN
25.0000 mg | Freq: Once | INTRAMUSCULAR | Status: AC
  Administered 2018-07-11: 25 mg via INTRAMUSCULAR
  Filled 2018-07-10: qty 1

## 2018-07-10 MED ORDER — OSELTAMIVIR PHOSPHATE 75 MG PO CAPS: 75.0000 mg | ORAL_CAPSULE | Freq: Two times a day (BID) | ORAL | 0 refills | Status: DC

## 2018-07-10 MED ORDER — OSELTAMIVIR PHOSPHATE 75 MG PO CAPS
75.0000 mg | ORAL_CAPSULE | Freq: Once | ORAL | Status: AC
  Administered 2018-07-11: 75 mg via ORAL
  Filled 2018-07-10: qty 1

## 2018-07-10 MED ORDER — BENZONATATE 100 MG PO CAPS: 100.0000 mg | ORAL_CAPSULE | Freq: Three times a day (TID) | ORAL | 0 refills | Status: DC | PRN

## 2018-07-10 MED ORDER — PREDNISONE 20 MG PO TABS: 40.0000 mg | ORAL_TABLET | Freq: Every day | ORAL | 0 refills | Status: DC

## 2018-07-10 MED ORDER — ALBUTEROL SULFATE HFA 108 (90 BASE) MCG/ACT IN AERS
2.0000 | INHALATION_SPRAY | RESPIRATORY_TRACT | Status: DC | PRN
Start: 2018-07-10 — End: 2018-07-11
  Administered 2018-07-11: 2 via RESPIRATORY_TRACT
  Filled 2018-07-10: qty 6.7

## 2018-07-10 MED ORDER — PREDNISONE 50 MG PO TABS
60.0000 mg | ORAL_TABLET | Freq: Once | ORAL | Status: AC
  Administered 2018-07-11: 60 mg via ORAL
  Filled 2018-07-10: qty 1

## 2018-07-10 MED ORDER — PROMETHAZINE HCL 25 MG PO TABS: 25.0000 mg | ORAL_TABLET | Freq: Four times a day (QID) | ORAL | 0 refills | Status: DC | PRN

## 2018-07-10 MED ORDER — KETOROLAC TROMETHAMINE 60 MG/2ML IM SOLN
60.0000 mg | Freq: Once | INTRAMUSCULAR | Status: AC
  Administered 2018-07-11: 60 mg via INTRAMUSCULAR
  Filled 2018-07-10: qty 2

## 2018-07-10 NOTE — ED Triage Notes (Signed)
Pt c/o migraine started this morning.  As well as sinus issues. Cough, n/v aches.  Took cold medicine with no relief.

## 2018-07-10 NOTE — ED Provider Notes (Signed)
[facility] EMERGENCY DEPARTMENT Provider Note   CSN: [id number] Arrival date & time: 07/10/18  2141     History   Chief Complaint Chief Complaint  Patient presents with  . Flu Like Symptoms    HPI Margaret Jennings is a 32 y.o. female.  Patient started with cough and chest congestion yesterday.  She reports that she has a history of asthma, has been out of her albuterol.  Her wheezing has not been severe, however.  Today she developed a headache.  She reports that she has recurrent headaches that she thinks are migraines but has never had a formal diagnosis.  She also has problems with recurrent sinusitis.  She has noticed pain around the right IN the right side of her face with throbbing headache.  This headache is consistent with previous headaches.  Headache came on slowing progressed over time.  No associated fever, neck pain, neck stiffness.  She has had nausea and vomiting.  She took over the counter cold medicine without relief.     Past Medical History:  Diagnosis Date  . Asthma   . Laryngitis     There are no active problems to display for this patient.   Past Surgical History:  Procedure Laterality Date  . CESAREAN SECTION  [id number]  . TUBAL LIGATION       OB History   No obstetric history on file.      Home Medications    Prior to Admission medications   Medication Sig Start Date End Date Taking? Authorizing Provider  benzonatate (TESSALON) 100 MG capsule Take 1 capsule (100 mg total) by mouth 3 (three) times daily as needed for cough. 07/10/18   Gilda Crease, MD  HYDROcodone-acetaminophen (NORCO) 5-325 MG tablet Take 1 tablet by mouth every 6 (six) hours as needed for moderate pain. 02/03/18   Dartha Lodge, PA-C  ibuprofen (ADVIL,MOTRIN) 600 MG tablet Take 1 tablet (600 mg total) by mouth every 6 (six) hours as needed. 02/03/18   Dartha Lodge, PA-C  Levonorgestrel-Ethinyl Estradiol (AMETHIA,CAMRESE) 0.15-0.03 &0.01 MG tablet Take 1  tablet by mouth daily. 08/27/15   Lazaro Arms, MD  oseltamivir (TAMIFLU) 75 MG capsule Take 1 capsule (75 mg total) by mouth every 12 (twelve) hours. 07/10/18   Gilda Crease, MD  predniSONE (DELTASONE) 20 MG tablet Take 2 tablets (40 mg total) by mouth daily with breakfast. 07/10/18   Shannelle Alguire, Canary Brim, MD  promethazine (PHENERGAN) 25 MG tablet Take 1 tablet (25 mg total) by mouth every 6 (six) hours as needed for nausea or vomiting. 07/10/18   Caryssa Elzey, Canary Brim, MD    Family History History reviewed. No pertinent family history.  Social History Social History   Tobacco Use  . Smoking status: Former Games developer  . Smokeless tobacco: Never Used  Substance Use Topics  . Alcohol use: Yes    Comment: occ.  . Drug use: No     Allergies   Patient has no known allergies.   Review of Systems Review of Systems  HENT: Positive for congestion.   Respiratory: Positive for cough and wheezing.   Gastrointestinal: Positive for nausea and vomiting.  Neurological: Positive for headaches.  All other systems reviewed and are negative.    Physical Exam Updated Vital Signs BP (!) 140/91 (BP Location: Right Arm)   Pulse 99   Temp 99.2 F (37.3 C) (Oral)   Resp 18   Ht 5\' 9"  (1.753 m)   Wt 93.9 kg  SpO2 97%   BMI 30.57 kg/m   Physical Exam Vitals signs and nursing note reviewed.  Constitutional:      General: She is not in acute distress.    Appearance: Normal appearance. She is well-developed.  HENT:     Head: Normocephalic and atraumatic.     Right Ear: Hearing normal.     Left Ear: Hearing normal.     Nose: Nose normal.  Eyes:     Conjunctiva/sclera: Conjunctivae normal.     Pupils: Pupils are equal, round, and reactive to light.  Neck:     Musculoskeletal: Normal range of motion and neck supple.  Cardiovascular:     Rate and Rhythm: Regular rhythm.     Heart sounds: S1 normal and S2 normal. No murmur. No friction rub. No gallop.   Pulmonary:      Effort: Pulmonary effort is normal. No respiratory distress.     Breath sounds: Normal breath sounds.  Chest:     Chest wall: No tenderness.  Abdominal:     General: Bowel sounds are normal.     Palpations: Abdomen is soft.     Tenderness: There is no abdominal tenderness. There is no guarding or rebound. Negative signs include Murphy's sign and McBurney's sign.     Hernia: No hernia is present.  Musculoskeletal: Normal range of motion.  Skin:    General: Skin is warm and dry.     Findings: No rash.  Neurological:     Mental Status: She is alert and oriented to person, place, and time.     GCS: GCS eye subscore is 4. GCS verbal subscore is 5. GCS motor subscore is 6.     Cranial Nerves: No cranial nerve deficit.     Sensory: No sensory deficit.     Coordination: Coordination normal.  Psychiatric:        Speech: Speech normal.        Behavior: Behavior normal.        Thought Content: Thought content normal.      ED Treatments / Results  Labs (all labs ordered are listed, but only abnormal results are displayed) Labs Reviewed  GROUP A STREP BY PCR    EKG None  Radiology No results found.  Procedures Procedures (including critical care time)  Medications Ordered in ED Medications  ketorolac (TORADOL) injection 60 mg (has no administration in time range)  metoCLOPramide (REGLAN) injection 10 mg (has no administration in time range)  diphenhydrAMINE (BENADRYL) injection 25 mg (has no administration in time range)  oseltamivir (TAMIFLU) capsule 75 mg (has no administration in time range)  predniSONE (DELTASONE) tablet 60 mg (has no administration in time range)  albuterol (PROVENTIL HFA;VENTOLIN HFA) 108 (90 Base) MCG/ACT inhaler 2 puff (has no administration in time range)     Initial Impression / Assessment and Plan / ED Course  I have reviewed the triage vital signs and the nursing notes.  Pertinent labs & imaging results that were available during my care of the  patient were reviewed by me and considered in my medical decision making (see chart for details).     Patient presents to the emergency department for evaluation of headache. Patient is currently experiencing a headache that is similar to previous headaches. Patient does not have any unusual features compared to previous headaches. Patient has normal neurologic examination. There are no unusual features, such as unusual intensity or sudden onset. As this headache is similar to previous headaches, there is no concern for  subarachnoid hemorrhage or other etiology. Patient therefore does not require imaging. Patient treated as migraine headache.  Patient also experiencing upper respiratory infection symptoms.  She has a history of asthma, will initiate treatment for asthma as well as empiric flu coverage.  Final Clinical Impressions(s) / ED Diagnoses   Final diagnoses:  Flu  Mild intermittent asthma with acute exacerbation    ED Discharge Orders         Ordered    oseltamivir (TAMIFLU) 75 MG capsule  Every 12 hours     07/10/18 2358    predniSONE (DELTASONE) 20 MG tablet  Daily with breakfast     07/10/18 2358    benzonatate (TESSALON) 100 MG capsule  3 times daily PRN     07/10/18 2358    promethazine (PHENERGAN) 25 MG tablet  Every 6 hours PRN     07/10/18 2358           Gilda CreasePollina, Gabino Hagin J, MD 07/10/18 2359

## 2018-09-16 IMAGING — CR DG CHEST 2V
2 series · 2 of 2 positions shown · non-contrast
Comparison: None.

CLINICAL DATA: Chest tightness and shortness of breath beginning at
1 a.m. last night, worsening.

EXAM:
CHEST  2 VIEW

[chest pa]
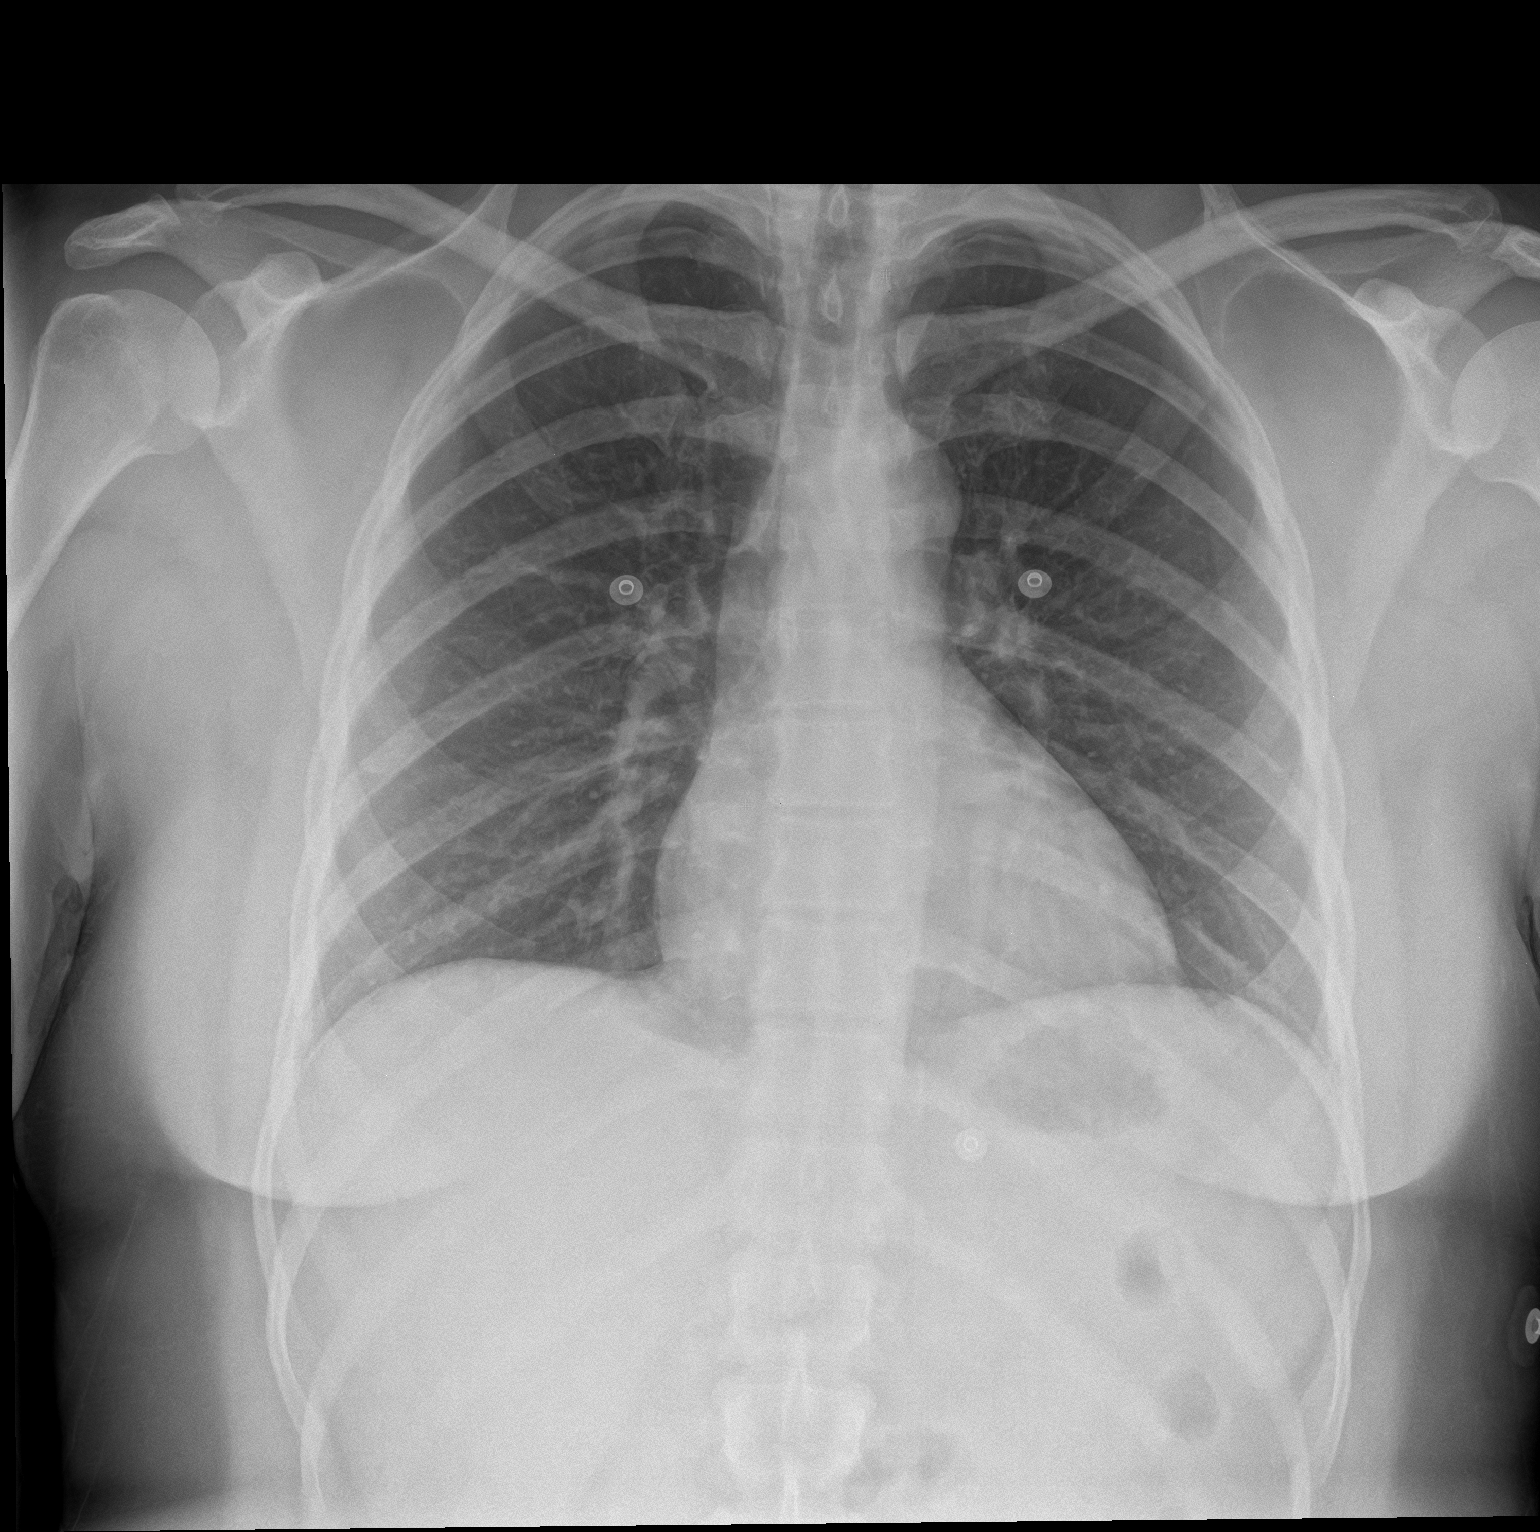

[chest lat]
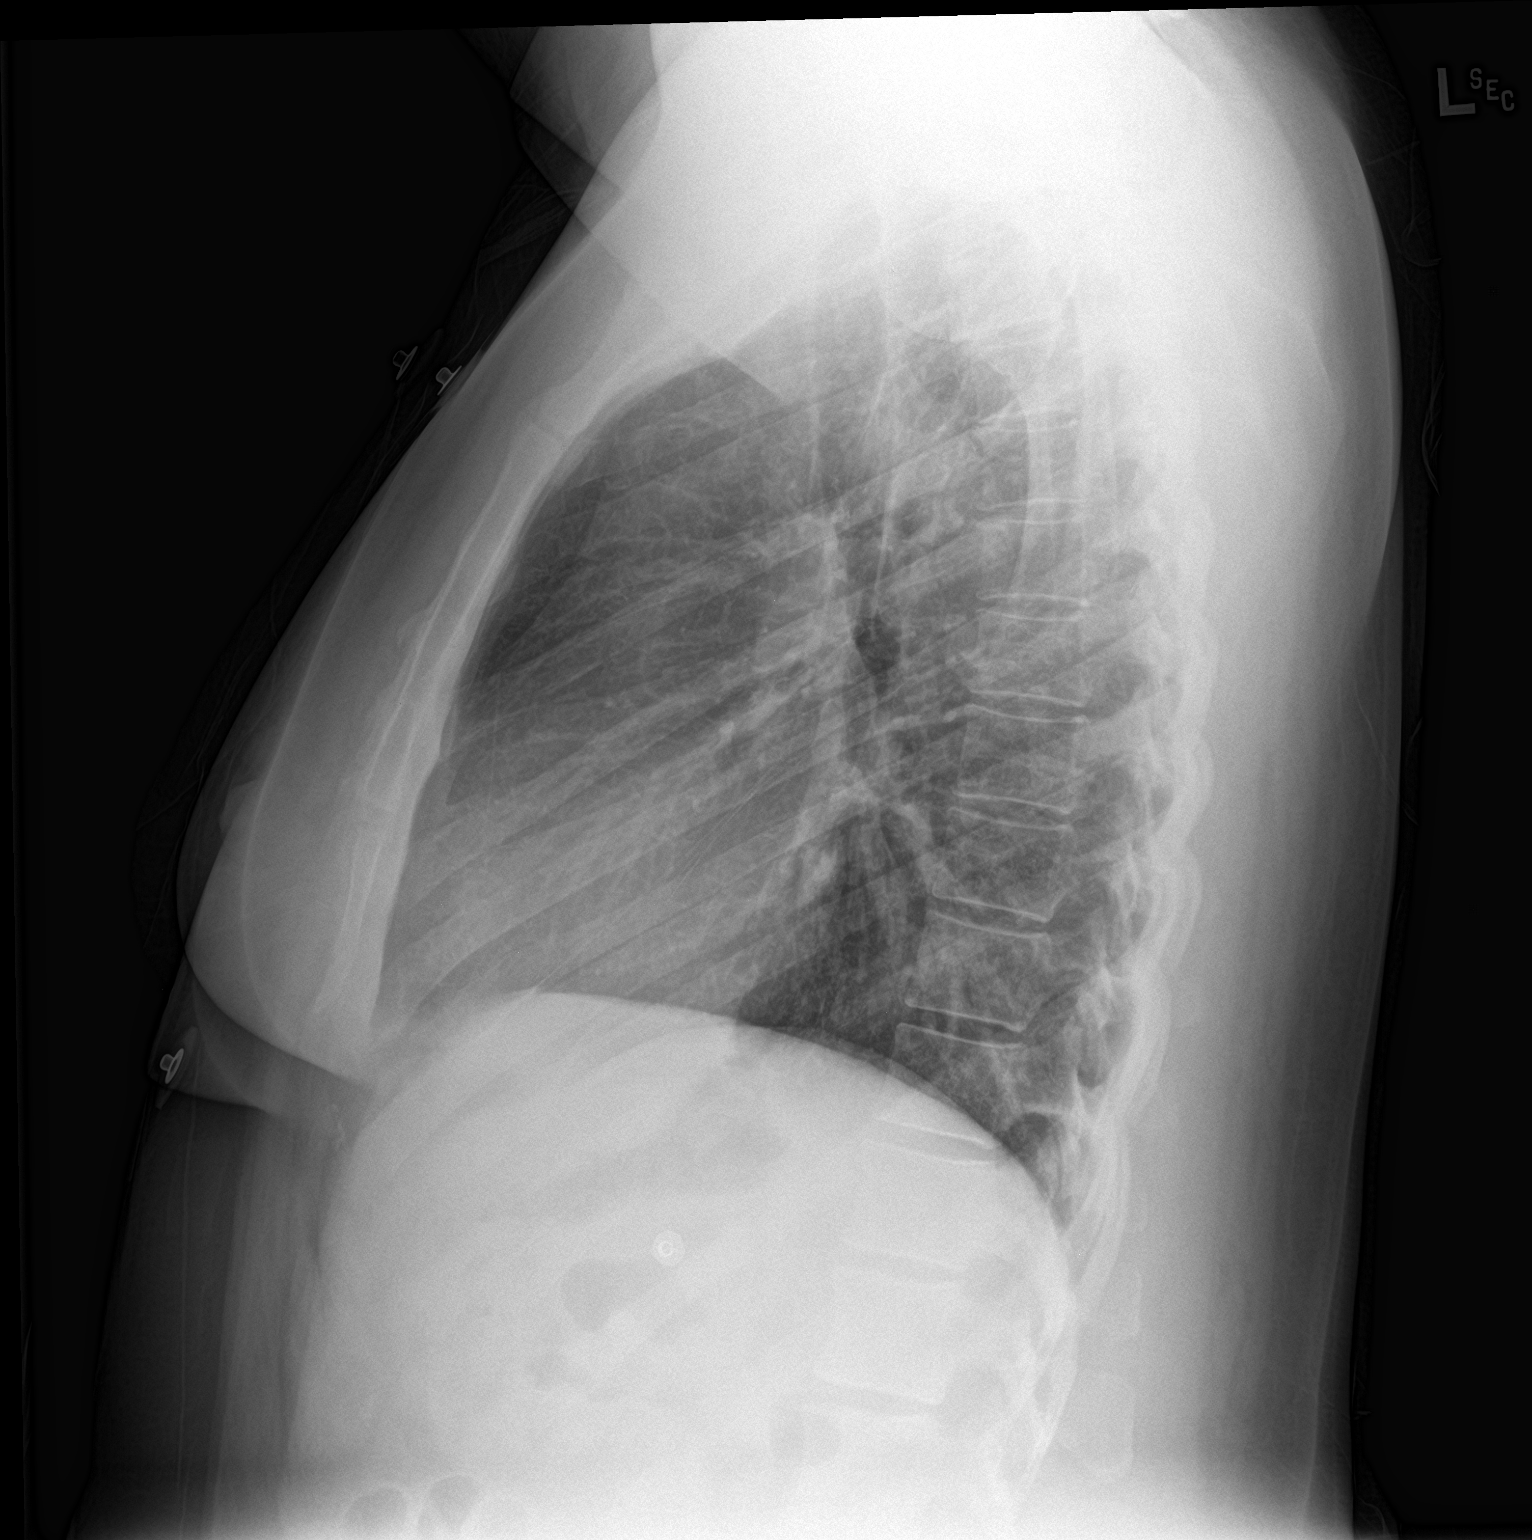

[2 of 2 positions shown; findings below may reference images not displayed]

FINDINGS: The lungs are clear. Heart size is normal. There is no pneumothorax
or pleural effusion. No bony abnormality.
IMPRESSION: Normal chest.

## 2019-04-11 ENCOUNTER — Other Ambulatory Visit: Payer: Self-pay

## 2019-04-11 DIAGNOSIS — Z20822 Contact with and (suspected) exposure to covid-19: Secondary | ICD-10-CM

## 2019-04-12 LAB — NOVEL CORONAVIRUS, NAA: SARS-CoV-2, NAA: NOT DETECTED

## 2019-05-09 ENCOUNTER — Emergency Department (HOSPITAL_COMMUNITY)
Admission: EM | Admit: 2019-05-09 | Discharge: 2019-05-09 | Disposition: A | Discharge status: Home / Self Care | Payer: Self-pay | Attending: Emergency Medicine | Admitting: Emergency Medicine

## 2019-05-09 ENCOUNTER — Emergency Department (HOSPITAL_COMMUNITY): Payer: Self-pay

## 2019-05-09 ENCOUNTER — Encounter (HOSPITAL_COMMUNITY): Payer: Self-pay

## 2019-05-09 ENCOUNTER — Other Ambulatory Visit: Payer: Self-pay

## 2019-05-09 DIAGNOSIS — J45909 Unspecified asthma, uncomplicated: Secondary | ICD-10-CM | POA: Insufficient documentation

## 2019-05-09 DIAGNOSIS — R519 Headache, unspecified: Secondary | ICD-10-CM | POA: Insufficient documentation

## 2019-05-09 DIAGNOSIS — R200 Anesthesia of skin: Secondary | ICD-10-CM | POA: Insufficient documentation

## 2019-05-09 DIAGNOSIS — R531 Weakness: Secondary | ICD-10-CM | POA: Insufficient documentation

## 2019-05-09 DIAGNOSIS — Z87891 Personal history of nicotine dependence: Secondary | ICD-10-CM | POA: Insufficient documentation

## 2019-05-09 LAB — BASIC METABOLIC PANEL
Anion gap: 9 (ref 5–15)
BUN: 13 mg/dL (ref 6–20)
CO2: 22 mmol/L (ref 22–32)
Calcium: 9 mg/dL (ref 8.9–10.3)
Chloride: 109 mmol/L (ref 98–111)
Creatinine, Ser: 0.98 mg/dL (ref 0.44–1.00)
GFR calc Af Amer: >60 mL/min (ref >60)
GFR calc non Af Amer: >60 mL/min (ref >60)
Glucose, Bld: 110 mg/dL — ABNORMAL HIGH (ref 70–99)
Potassium: 4 mmol/L (ref 3.5–5.1)
Sodium: 140 mmol/L (ref 135–145)

## 2019-05-09 LAB — CBC WITH DIFFERENTIAL/PLATELET
Abs Immature Granulocytes: 0.01 10*3/uL (ref 0.00–0.07)
Basophils Absolute: 0 10*3/uL (ref 0.0–0.1)
Basophils Relative: 1 %
Eosinophils Absolute: 0.1 10*3/uL (ref 0.0–0.5)
Eosinophils Relative: 1 %
HCT: 42.4 % (ref 36.0–46.0)
Hemoglobin: 13.6 g/dL (ref 12.0–15.0)
Immature Granulocytes: 0 %
Lymphocytes Relative: 26 %
Lymphs Abs: 2.3 10*3/uL (ref 0.7–4.0)
MCH: 28 pg (ref 26.0–34.0)
MCHC: 32.1 g/dL (ref 30.0–36.0)
MCV: 87.4 fL (ref 80.0–100.0)
Monocytes Absolute: 0.6 10*3/uL (ref 0.1–1.0)
Monocytes Relative: 7 %
Neutro Abs: 5.6 10*3/uL (ref 1.7–7.7)
Neutrophils Relative %: 65 %
Platelets: 223 10*3/uL (ref 150–400)
RBC: 4.85 MIL/uL (ref 3.87–5.11)
RDW: 12 % (ref 11.5–15.5)
WBC: 8.5 10*3/uL (ref 4.0–10.5)
nRBC: 0 % (ref 0.0–0.2)

## 2019-05-09 MED ORDER — SODIUM CHLORIDE 0.9 % IV BOLUS
1000.0000 mL | Freq: Once | INTRAVENOUS | Status: AC
  Administered 2019-05-09: 1000 mL via INTRAVENOUS

## 2019-05-09 MED ORDER — FENTANYL CITRATE (PF) 100 MCG/2ML IJ SOLN
50.0000 ug | Freq: Once | INTRAMUSCULAR | Status: AC
  Administered 2019-05-09: 14:00:00 50 ug via INTRAVENOUS
  Filled 2019-05-09: qty 2

## 2019-05-09 MED ORDER — FENTANYL CITRATE (PF) 100 MCG/2ML IJ SOLN
50.0000 ug | Freq: Once | INTRAMUSCULAR | Status: AC
  Administered 2019-05-09: 50 ug via INTRAVENOUS
  Filled 2019-05-09: qty 2

## 2019-05-09 MED ORDER — ONDANSETRON HCL 4 MG/2ML IJ SOLN
4.0000 mg | Freq: Once | INTRAMUSCULAR | Status: AC
  Administered 2019-05-09: 4 mg via INTRAVENOUS
  Filled 2019-05-09: qty 2

## 2019-05-09 NOTE — ED Triage Notes (Signed)
Pt reports was very stressed and tearful yesterday.  Reports she got laid off and was crying a lot.  Reports had a headache around 11pm last night.  Reports woke up at 0730 and noticed r side of body felt numb.  Pt says still feeling some numbness but not quite as bad as when she first woke up.  Reports initially she couldn't move her r foot and fell when she got up.  States is now able to move everything but still doesn't feel right.  Still c/o headache as well.

## 2019-05-09 NOTE — Discharge Instructions (Signed)
CT and MRI of your brain were all normal.  This is not a stroke.  Likely related to stress.  Home to rest.  Follow-up with your primary care doctor.

## 2019-05-09 NOTE — ED Provider Notes (Addendum)
[facility] EMERGENCY DEPARTMENT Provider Note   CSN: [id number] Arrival date & time: 05/09/19  2355     History   Chief Complaint Chief Complaint  Patient presents with  . Numbness    HPI Margaret Jennings is a 33 y.o. female.     Level 5 caveat for acuity of condition.  Chief complaint right-sided numbness.  Patient awoke at 0730 this morning with symptoms.  She reports a right-sided headache approximately 2300 last night.  Increased life stress lately.  No chronic neurovascular health problems.  Severity is moderate.  Nothing makes symptoms better or worse.     Past Medical History:  Diagnosis Date  . Asthma   . Laryngitis     There are no active problems to display for this patient.   Past Surgical History:  Procedure Laterality Date  . CESAREAN SECTION  [id number]  . TUBAL LIGATION       OB History   No obstetric history on file.      Home Medications    Prior to Admission medications   Medication Sig Start Date End Date Taking? Authorizing Provider  medroxyPROGESTERone (DEPO-PROVERA) 150 MG/ML injection Inject 150 mg into the muscle every 3 (three) months. 04/06/19  Yes [provider]  benzonatate (TESSALON) 100 MG capsule Take 1 capsule (100 mg total) by mouth 3 (three) times daily as needed for cough. Patient not taking: Reported on 05/09/2019 07/10/18   Gilda Crease, MD  HYDROcodone-acetaminophen (NORCO) 5-325 MG tablet Take 1 tablet by mouth every 6 (six) hours as needed for moderate pain. Patient not taking: Reported on 05/09/2019 02/03/18   Dartha Lodge, PA-C  ibuprofen (ADVIL,MOTRIN) 600 MG tablet Take 1 tablet (600 mg total) by mouth every 6 (six) hours as needed. Patient not taking: Reported on 05/09/2019 02/03/18   Dartha Lodge, PA-C  Levonorgestrel-Ethinyl Estradiol (AMETHIA,CAMRESE) 0.15-0.03 &0.01 MG tablet Take 1 tablet by mouth daily. Patient not taking: Reported on 05/09/2019 08/27/15   Lazaro Arms, MD   oseltamivir (TAMIFLU) 75 MG capsule Take 1 capsule (75 mg total) by mouth every 12 (twelve) hours. Patient not taking: Reported on 05/09/2019 07/10/18   Gilda Crease, MD  predniSONE (DELTASONE) 20 MG tablet Take 2 tablets (40 mg total) by mouth daily with breakfast. Patient not taking: Reported on 05/09/2019 07/10/18   Gilda Crease, MD  promethazine (PHENERGAN) 25 MG tablet Take 1 tablet (25 mg total) by mouth every 6 (six) hours as needed for nausea or vomiting. Patient not taking: Reported on 05/09/2019 07/10/18   Gilda Crease, MD    Family History No family history on file.  Social History Social History   Tobacco Use  . Smoking status: Former Games developer  . Smokeless tobacco: Never Used  Substance Use Topics  . Alcohol use: Yes    Comment: occ.  . Drug use: No     Allergies   Patient has no known allergies.   Review of Systems Review of Systems  Unable to perform ROS: Acuity of condition     Physical Exam Updated Vital Signs BP 123/81 (BP Location: Left Arm)   Pulse 71   Temp 98.3 F (36.8 C) (Oral)   Resp 14   Ht 5\' 8"  (1.727 m)   Wt 90.7 kg   SpO2 100%   BMI 30.41 kg/m   Physical Exam Vitals signs and nursing note reviewed.  Constitutional:      Appearance: She is well-developed.  HENT:  Head: Normocephalic and atraumatic.  Eyes:     Conjunctiva/sclera: Conjunctivae normal.  Neck:     Musculoskeletal: Neck supple.  Cardiovascular:     Rate and Rhythm: Normal rate and regular rhythm.  Pulmonary:     Effort: Pulmonary effort is normal.     Breath sounds: Normal breath sounds.  Abdominal:     General: Bowel sounds are normal.     Palpations: Abdomen is soft.  Musculoskeletal: Normal range of motion.  Skin:    General: Skin is warm and dry.  Neurological:     Mental Status: She is alert and oriented to person, place, and time.     Comments: ?  Weakness right arm/leg.  Psychiatric:        Behavior: Behavior normal.       ED Treatments / Results  Labs (all labs ordered are listed, but only abnormal results are displayed) Labs Reviewed  BASIC METABOLIC PANEL - Abnormal; Notable for the following components:      Result Value   Glucose, Bld 110 (*)    All other components within normal limits  CBC WITH DIFFERENTIAL/PLATELET    EKG None  Radiology Ct Head Wo Contrast  Result Date: 05/09/2019 CLINICAL DATA:  Headache, right-sided body numbness EXAM: CT HEAD WITHOUT CONTRAST TECHNIQUE: Contiguous axial images were obtained from the base of the skull through the vertex without intravenous contrast. COMPARISON:  None. FINDINGS: Brain: No evidence of acute infarction, hemorrhage, hydrocephalus, extra-axial collection or mass lesion/mass effect. Vascular: No hyperdense vessel or unexpected calcification. Skull: Normal. Negative for fracture or focal lesion. Sinuses/Orbits: No acute finding. Other: None. IMPRESSION: No acute intracranial pathology. No non-contrast CT findings to explain headache or right-sided numbness. Electronically Signed   By: Eddie Candle M.D.   On: 05/09/2019 11:08   Mr Brain Wo Contrast  Result Date: 05/09/2019 CLINICAL DATA:  Right arm and leg numbness EXAM: MRI HEAD WITHOUT CONTRAST TECHNIQUE: Multiplanar, multiecho pulse sequences of the brain and surrounding structures were obtained without intravenous contrast. COMPARISON:  Head CT same day FINDINGS: Brain: The brain has a normal appearance without evidence of malformation, atrophy, old or acute small or large vessel infarction, mass lesion, hemorrhage, hydrocephalus or extra-axial collection. Vascular: Major vessels at the base of the brain show flow. Venous sinuses appear patent. Skull and upper cervical spine: Normal. Sinuses/Orbits: Clear/normal. Other: None significant. IMPRESSION: Normal examination. Electronically Signed   By: Nelson Chimes M.D.   On: 05/09/2019 14:02    Procedures Procedures (including critical care time)   Medications Ordered in ED Medications  sodium chloride 0.9 % bolus 1,000 mL (0 mLs Intravenous Stopped 05/09/19 1407)  ondansetron (ZOFRAN) injection 4 mg (4 mg Intravenous Given 05/09/19 1017)  fentaNYL (SUBLIMAZE) injection 50 mcg (50 mcg Intravenous Given 05/09/19 1017)  fentaNYL (SUBLIMAZE) injection 50 mcg (50 mcg Intravenous Given 05/09/19 1405)     Initial Impression / Assessment and Plan / ED Course  I have reviewed the triage vital signs and the nursing notes.  Pertinent labs & imaging results that were available during my care of the patient were reviewed by me and considered in my medical decision making (see chart for details).       Patient awoke this morning with right-sided arm and leg numbness/weakness.  Basic labs and CT head negative.  Will obtain MRI of brain.  1425: Recheck.  Normal neuro exam.  CT head and MRI brain negative.  Discussed with patient. Final Clinical Impressions(s) / ED Diagnoses   Final  diagnoses:  Numbness    ED Discharge Orders    None       Donnetta Hutchingook, Annalysia Willenbring, MD 05/09/19 1409    Donnetta Hutchingook, Claudina Oliphant, MD 05/09/19 1426

## 2019-05-10 ENCOUNTER — Other Ambulatory Visit: Payer: Self-pay

## 2019-05-10 DIAGNOSIS — Z20822 Contact with and (suspected) exposure to covid-19: Secondary | ICD-10-CM

## 2019-05-11 LAB — NOVEL CORONAVIRUS, NAA: SARS-CoV-2, NAA: NOT DETECTED

## 2019-05-12 ENCOUNTER — Telehealth: Payer: Self-pay

## 2019-05-12 NOTE — Telephone Encounter (Signed)
Negative COVID results given. Patient results "NOT Detected." Caller expressed understanding. ° °
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
## 2021-06-07 ENCOUNTER — Emergency Department (HOSPITAL_COMMUNITY): Payer: Medicaid Other

## 2021-06-07 ENCOUNTER — Other Ambulatory Visit: Payer: Self-pay

## 2021-06-07 ENCOUNTER — Encounter (HOSPITAL_COMMUNITY): Payer: Self-pay | Admitting: *Deleted

## 2021-06-07 ENCOUNTER — Emergency Department (HOSPITAL_COMMUNITY)
Admission: EM | Admit: 2021-06-07 | Discharge: 2021-06-08 | Disposition: A | Discharge status: Home / Self Care | Payer: Medicaid Other | Attending: Emergency Medicine | Admitting: Emergency Medicine

## 2021-06-07 DIAGNOSIS — Z87891 Personal history of nicotine dependence: Secondary | ICD-10-CM | POA: Insufficient documentation

## 2021-06-07 DIAGNOSIS — R1030 Lower abdominal pain, unspecified: Secondary | ICD-10-CM | POA: Insufficient documentation

## 2021-06-07 DIAGNOSIS — J45909 Unspecified asthma, uncomplicated: Secondary | ICD-10-CM | POA: Insufficient documentation

## 2021-06-07 LAB — CBC WITH DIFFERENTIAL/PLATELET
Abs Immature Granulocytes: 0.02 10*3/uL (ref 0.00–0.07)
Basophils Absolute: 0 10*3/uL (ref 0.0–0.1)
Basophils Relative: 1 %
Eosinophils Absolute: 0 10*3/uL (ref 0.0–0.5)
Eosinophils Relative: 0 %
HCT: 40.1 % (ref 36.0–46.0)
Hemoglobin: 13 g/dL (ref 12.0–15.0)
Immature Granulocytes: 0 %
Lymphocytes Relative: 13 %
Lymphs Abs: 0.7 10*3/uL (ref 0.7–4.0)
MCH: 27.8 pg (ref 26.0–34.0)
MCHC: 32.4 g/dL (ref 30.0–36.0)
MCV: 85.9 fL (ref 80.0–100.0)
Monocytes Absolute: 0.5 10*3/uL (ref 0.1–1.0)
Monocytes Relative: 9 %
Neutro Abs: 4 10*3/uL (ref 1.7–7.7)
Neutrophils Relative %: 77 %
Platelets: 180 10*3/uL (ref 150–400)
RBC: 4.67 MIL/uL (ref 3.87–5.11)
RDW: 13.1 % (ref 11.5–15.5)
WBC: 5.2 10*3/uL (ref 4.0–10.5)
nRBC: 0 % (ref 0.0–0.2)

## 2021-06-07 LAB — COMPREHENSIVE METABOLIC PANEL
ALT: 18 U/L (ref 0–44)
AST: 17 U/L (ref 15–41)
Albumin: 3.6 g/dL (ref 3.5–5.0)
Alkaline Phosphatase: 67 U/L (ref 38–126)
Anion gap: 8 (ref 5–15)
BUN: 10 mg/dL (ref 6–20)
CO2: 23 mmol/L (ref 22–32)
Calcium: 8.8 mg/dL — ABNORMAL LOW (ref 8.9–10.3)
Chloride: 103 mmol/L (ref 98–111)
Creatinine, Ser: 1.12 mg/dL — ABNORMAL HIGH (ref 0.44–1.00)
GFR, Estimated: >60 mL/min (ref >60)
Glucose, Bld: 87 mg/dL (ref 70–99)
Potassium: 3.6 mmol/L (ref 3.5–5.1)
Sodium: 134 mmol/L — ABNORMAL LOW (ref 135–145)
Total Bilirubin: 0.5 mg/dL (ref 0.3–1.2)
Total Protein: 7.6 g/dL (ref 6.5–8.1)

## 2021-06-07 LAB — URINALYSIS, ROUTINE W REFLEX MICROSCOPIC
Bilirubin Urine: NEGATIVE
Glucose, UA: NEGATIVE mg/dL
Ketones, ur: NEGATIVE mg/dL
Nitrite: NEGATIVE
Specific Gravity, Urine: 1.025 (ref 1.005–1.030)
pH: 6 (ref 5.0–8.0)

## 2021-06-07 LAB — URINALYSIS, MICROSCOPIC (REFLEX)

## 2021-06-07 LAB — PREGNANCY, URINE: Preg Test, Ur: NEGATIVE

## 2021-06-07 LAB — WET PREP, GENITAL
Clue Cells Wet Prep HPF POC: NONE SEEN
Sperm: NONE SEEN
Trich, Wet Prep: NONE SEEN
WBC, Wet Prep HPF POC: <10 — AB (ref <10)
Yeast Wet Prep HPF POC: NONE SEEN

## 2021-06-07 MED ORDER — SODIUM CHLORIDE 0.9 % IV BOLUS
1000.0000 mL | Freq: Once | INTRAVENOUS | Status: AC
  Administered 2021-06-07: 1000 mL via INTRAVENOUS

## 2021-06-07 MED ORDER — ACETAMINOPHEN 325 MG PO TABS
650.0000 mg | ORAL_TABLET | Freq: Once | ORAL | Status: AC
  Administered 2021-06-07: 650 mg via ORAL
  Filled 2021-06-07: qty 2

## 2021-06-07 MED ORDER — IOHEXOL 300 MG/ML  SOLN
100.0000 mL | Freq: Once | INTRAMUSCULAR | Status: AC | PRN
  Administered 2021-06-07: 100 mL via INTRAVENOUS

## 2021-06-07 MED ORDER — CEPHALEXIN 500 MG PO CAPS: 500.0000 mg | ORAL_CAPSULE | Freq: Three times a day (TID) | ORAL | 0 refills | Status: AC

## 2021-06-07 NOTE — ED Triage Notes (Signed)
Pt with lower abd and back pain, pain between legs per pt since yesterday.  Fever at home as high as 102 per pt, took tylenol at 1600 today. Noted some brown discharge with wiping.  Pt believes she may have left a tampon in.  Denies any vagina odor.

## 2021-06-07 NOTE — Discharge Instructions (Signed)
Your urine had some bacteria.  Take the antibiotics as written. Call your primary care doctor or specialist as discussed in the next 2-3 days.   Return immediately back to the ER if:  Your symptoms worsen within the next 12-24 hours. You develop new symptoms such as new fevers, persistent vomiting, new pain, shortness of breath, or new weakness or numbness, or if you have any other concerns.

## 2021-06-07 NOTE — ED Provider Notes (Signed)
[facility] EMERGENCY DEPARTMENT Provider Note   CSN: [id number] Arrival date & time: 06/07/21  1738     History Chief Complaint  Patient presents with   Abdominal Pain    Margaret Jennings is a 35 y.o. female.  Patient presents ER chief complaint of lower abdominal pain.  Describes an aching pain in the suprapubic region.  Ongoing since yesterday.  Also noted fever 102 at home today took Tylenol.  She states that she also noticed some brownish vaginal discharge.  She became concerned that she may have left a tampon in for unknown duration and presents to the ER for evaluation.  She states she is not been currently sexually active for the past 3 to 4 months.  Otherwise no reports of vomiting or diarrhea.  No chest pain or shortness of breath.      Past Medical History:  Diagnosis Date   Asthma    Laryngitis     There are no problems to display for this patient.   Past Surgical History:  Procedure Laterality Date   CESAREAN SECTION  [phone]   TUBAL LIGATION       OB History   No obstetric history on file.     History reviewed. No pertinent family history.  Social History   Tobacco Use   Smoking status: Former   Smokeless tobacco: Never  Substance Use Topics   Alcohol use: Yes    Comment: occ.   Drug use: No    Home Medications Prior to Admission medications   Medication Sig Start Date End Date Taking? Authorizing Provider  cephALEXin (KEFLEX) 500 MG capsule Take 1 capsule (500 mg total) by mouth 3 (three) times daily for 5 days. 06/07/21 06/12/21 Yes Cheryll Cockayne, MD  benzonatate (TESSALON) 100 MG capsule Take 1 capsule (100 mg total) by mouth 3 (three) times daily as needed for cough. Patient not taking: Reported on 05/09/2019 07/10/18   Gilda Crease, MD  HYDROcodone-acetaminophen (NORCO) 5-325 MG tablet Take 1 tablet by mouth every 6 (six) hours as needed for moderate pain. Patient not taking: Reported on 05/09/2019 02/03/18   Dartha Lodge, PA-C  ibuprofen (ADVIL,MOTRIN) 600 MG tablet Take 1 tablet (600 mg total) by mouth every 6 (six) hours as needed. Patient not taking: Reported on 05/09/2019 02/03/18   Dartha Lodge, PA-C  Levonorgestrel-Ethinyl Estradiol (AMETHIA,CAMRESE) 0.15-0.03 &0.01 MG tablet Take 1 tablet by mouth daily. Patient not taking: Reported on 05/09/2019 08/27/15   Lazaro Arms, MD  medroxyPROGESTERone (DEPO-PROVERA) 150 MG/ML injection Inject 150 mg into the muscle every 3 (three) months. 04/06/19   [provider]  oseltamivir (TAMIFLU) 75 MG capsule Take 1 capsule (75 mg total) by mouth every 12 (twelve) hours. Patient not taking: Reported on 05/09/2019 07/10/18   Gilda Crease, MD  predniSONE (DELTASONE) 20 MG tablet Take 2 tablets (40 mg total) by mouth daily with breakfast. Patient not taking: Reported on 05/09/2019 07/10/18   Gilda Crease, MD  promethazine (PHENERGAN) 25 MG tablet Take 1 tablet (25 mg total) by mouth every 6 (six) hours as needed for nausea or vomiting. Patient not taking: Reported on 05/09/2019 07/10/18   Gilda Crease, MD    Allergies    Patient has no known allergies.  Review of Systems   Review of Systems  Constitutional:  Negative for fever.  HENT:  Negative for ear pain.   Eyes:  Negative for pain.  Respiratory:  Negative for cough.   Cardiovascular:  Negative for chest pain.  Gastrointestinal:  Positive for abdominal pain.  Genitourinary:  Negative for flank pain.  Musculoskeletal:  Negative for back pain.  Skin:  Negative for rash.  Neurological:  Negative for headaches.   Physical Exam Updated Vital Signs BP 100/74   Pulse 84   Temp 97.7 F (36.5 C)   Resp 18   Ht 5\' 8"  (1.727 m)   Wt 90.7 kg   SpO2 100%   BMI 30.41 kg/m   Physical Exam Constitutional:      General: She is not in acute distress.    Appearance: Normal appearance.  HENT:     Head: Normocephalic.     Nose: Nose normal.  Eyes:     Extraocular  Movements: Extraocular movements intact.  Cardiovascular:     Rate and Rhythm: Normal rate.  Pulmonary:     Effort: Pulmonary effort is normal.  Abdominal:     Tenderness: There is abdominal tenderness in the suprapubic area.  Musculoskeletal:        General: Normal range of motion.     Cervical back: Normal range of motion.  Neurological:     General: No focal deficit present.     Mental Status: She is alert. Mental status is at baseline.    ED Results / Procedures / Treatments   Labs (all labs ordered are listed, but only abnormal results are displayed) Labs Reviewed  WET PREP, GENITAL - Abnormal; Notable for the following components:      Result Value   WBC, Wet Prep HPF POC <10 (*)    All other components within normal limits  COMPREHENSIVE METABOLIC PANEL - Abnormal; Notable for the following components:   Sodium 134 (*)    Creatinine, Ser 1.12 (*)    Calcium 8.8 (*)    All other components within normal limits  URINALYSIS, ROUTINE W REFLEX MICROSCOPIC - Abnormal; Notable for the following components:   APPearance HAZY (*)    Hgb urine dipstick LARGE (*)    Protein, ur TRACE (*)    Leukocytes,Ua SMALL (*)    All other components within normal limits  URINALYSIS, MICROSCOPIC (REFLEX) - Abnormal; Notable for the following components:   Bacteria, UA RARE (*)    All other components within normal limits  URINE CULTURE  CBC WITH DIFFERENTIAL/PLATELET  PREGNANCY, URINE  GC/CHLAMYDIA PROBE AMP ([facility]) NOT AT [facility]    EKG None  Radiology CT Abdomen Pelvis W Contrast  Result Date: 06/07/2021 CLINICAL DATA:  Lower abdominal pain and fever. EXAM: CT ABDOMEN AND PELVIS WITH CONTRAST TECHNIQUE: Multidetector CT imaging of the abdomen and pelvis was performed using the standard protocol following bolus administration of intravenous contrast. CONTRAST:  06/09/2021 OMNIPAQUE IOHEXOL 300 MG/ML  SOLN COMPARISON:  None. FINDINGS: Lower chest: No acute abnormality. Hepatobiliary:  No focal liver abnormality is seen. No gallstones, gallbladder wall thickening, or biliary dilatation. Pancreas: Unremarkable. No pancreatic ductal dilatation or surrounding inflammatory changes. Spleen: Normal in size without focal abnormality. Adrenals/Urinary Tract: Adrenal glands are unremarkable. Kidneys are normal, without renal calculi, focal lesion, or hydronephrosis. Bladder is unremarkable. Stomach/Bowel: There is a small hiatal hernia. Appendix appears normal. No evidence of bowel wall thickening, distention, or inflammatory changes. Vascular/Lymphatic: No significant vascular findings are present. No enlarged abdominal or pelvic lymph nodes. Reproductive: Uterus and bilateral adnexa are unremarkable. Other: No abdominal wall hernia or abnormality. No abdominopelvic ascites. Musculoskeletal: A bulging disc is seen at the level of L5-S1. IMPRESSION: 1. No acute intra-abdominal findings.  2. Small hiatal hernia. 3. Disc bulge at the level of L5-S1. Electronically Signed   By: Aram Candela M.D.   On: 06/07/2021 23:14    Procedures Procedures   Medications Ordered in ED Medications  sodium chloride 0.9 % bolus 1,000 mL (0 mLs Intravenous Stopped 06/07/21 2216)  acetaminophen (TYLENOL) tablet 650 mg (650 mg Oral Given 06/07/21 2057)  iohexol (OMNIPAQUE) 300 MG/ML solution 100 mL (100 mLs Intravenous Contrast Given 06/07/21 2256)    ED Course  I have reviewed the triage vital signs and the nursing notes.  Pertinent labs & imaging results that were available during my care of the patient were reviewed by me and considered in my medical decision making (see chart for details).    MDM Rules/Calculators/A&P                           Mild tenderness on suprapubic exam.  Labs are unremarkable white count normal, chemistry normal.  Urinalysis shows rare bacteria.  Pelvic exam performed with nursing chaperone, no foreign body noted.  Darkish appearing small amount of blood in the vaginal  vault.  On bimanual exam no adnexal tenderness or cervical motion tenderness noted.  CT abdomen pelvis pursued with no acute findings noted.  Patient remains stable vital signs are improved.  Will advise outpatient follow-up with OB/GYN within the next week.  Advising immediate return for fevers that are persistent, worsening pain discharge or any additional concerns.  Final Clinical Impression(s) / ED Diagnoses Final diagnoses:  Lower abdominal pain    Rx / DC Orders ED Discharge Orders          Ordered    cephALEXin (KEFLEX) 500 MG capsule  3 times daily        06/07/21 2327             Cheryll Cockayne, MD 06/07/21 [phone]

## 2021-06-07 NOTE — ED Notes (Signed)
Pt denied needing to give urine sample

## 2021-06-09 LAB — URINE CULTURE: Culture: NO GROWTH

## 2021-06-09 LAB — GC/CHLAMYDIA PROBE AMP (~~LOC~~) NOT AT ARMC
Chlamydia: NEGATIVE
Comment: NEGATIVE
Comment: NORMAL
Neisseria Gonorrhea: NEGATIVE

## 2022-07-15 ENCOUNTER — Other Ambulatory Visit: Payer: Self-pay

## 2022-07-15 ENCOUNTER — Encounter (HOSPITAL_COMMUNITY): Payer: Self-pay

## 2022-07-15 ENCOUNTER — Emergency Department (HOSPITAL_COMMUNITY): Payer: Medicaid Other

## 2022-07-15 DIAGNOSIS — M25561 Pain in right knee: Secondary | ICD-10-CM | POA: Insufficient documentation

## 2022-07-15 DIAGNOSIS — X509XXA Other and unspecified overexertion or strenuous movements or postures, initial encounter: Secondary | ICD-10-CM | POA: Insufficient documentation

## 2022-07-15 NOTE — ED Triage Notes (Signed)
Pt reports stood up Sunday and felt r knee pop.  Says knee has been swollen.  Says was told by her job she can't return until her knee has been evaluated.

## 2022-07-16 ENCOUNTER — Emergency Department (HOSPITAL_COMMUNITY)
Admission: EM | Admit: 2022-07-16 | Discharge: 2022-07-16 | Disposition: A | Discharge status: Home / Self Care | Payer: Self-pay | Attending: Emergency Medicine | Admitting: Emergency Medicine

## 2022-07-16 DIAGNOSIS — M25561 Pain in right knee: Secondary | ICD-10-CM

## 2022-07-16 MED ORDER — ACETAMINOPHEN 500 MG PO TABS
1000.0000 mg | ORAL_TABLET | Freq: Once | ORAL | Status: AC
  Administered 2022-07-16: 1000 mg via ORAL
  Filled 2022-07-16: qty 2

## 2022-07-16 MED ORDER — IBUPROFEN 800 MG PO TABS
800.0000 mg | ORAL_TABLET | Freq: Once | ORAL | Status: AC
  Administered 2022-07-16: 800 mg via ORAL
  Filled 2022-07-16: qty 1

## 2022-07-16 MED ORDER — IBUPROFEN 800 MG PO TABS: 800.0000 mg | ORAL_TABLET | Freq: Four times a day (QID) | ORAL | 0 refills | Status: DC | PRN

## 2022-07-16 NOTE — ED Provider Notes (Signed)
[facility] EMERGENCY DEPARTMENT Provider Note   CSN: [id number] Arrival date & time: 07/15/22  2031     History  Chief Complaint  Patient presents with   Knee Pain    Margaret Jennings is a 37 y.o. female.  Presents to the ER for evaluation of right knee pain.  Patient reports that she stood up earlier today and felt a pop.  She has been having persistent pain and swelling of the knee ever since.  No specific prior injuries to the knee.       Home Medications Prior to Admission medications   Medication Sig Start Date End Date Taking? Authorizing Provider  ibuprofen (ADVIL) 800 MG tablet Take 1 tablet (800 mg total) by mouth every 6 (six) hours as needed for moderate pain. 07/16/22  Yes Ardean Simonich, Gwenyth Allegra, MD  medroxyPROGESTERone (DEPO-PROVERA) 150 MG/ML injection Inject 150 mg into the muscle every 3 (three) months. 04/06/19   [provider]      Allergies    Patient has no known allergies.    Review of Systems   Review of Systems  Physical Exam Updated Vital Signs BP (!) 137/93 (BP Location: Right Arm)   Pulse (!) 109   Temp 98.5 F (36.9 C) (Oral)   Resp 18   Ht 5\' 9"  (1.753 m)   Wt 104.3 kg   SpO2 98%   BMI 33.97 kg/m  Physical Exam Vitals and nursing note reviewed.  Constitutional:      Appearance: Normal appearance.  HENT:     Head: Normocephalic and atraumatic.  Eyes:     Pupils: Pupils are equal, round, and reactive to light.  Cardiovascular:     Rate and Rhythm: Normal rate.  Pulmonary:     Effort: Pulmonary effort is normal.  Musculoskeletal:     Cervical back: Neck supple.     Right knee: No swelling, deformity, effusion, erythema or ecchymosis. Normal range of motion. Tenderness present.  Neurological:     Mental Status: She is alert.     ED Results / Procedures / Treatments   Labs (all labs ordered are listed, but only abnormal results are displayed) Labs Reviewed - No data to display  EKG None  Radiology DG Knee  Complete 4 Views Right  Result Date: 07/15/2022 CLINICAL DATA:  Pain and swelling EXAM: RIGHT KNEE - COMPLETE 4+ VIEW COMPARISON:  None Available. FINDINGS: No evidence of fracture, dislocation, or joint effusion. No evidence of arthropathy or other focal bone abnormality. Soft tissues are unremarkable. IMPRESSION: Negative. Electronically Signed   By: Donavan Foil M.D.   On: 07/15/2022 21:16    Procedures Procedures    Medications Ordered in ED Medications  acetaminophen (TYLENOL) tablet 1,000 mg (has no administration in time range)  ibuprofen (ADVIL) tablet 800 mg (has no administration in time range)    ED Course/ Medical Decision Making/ A&P                           Medical Decision Making Amount and/or Complexity of Data Reviewed Radiology: ordered.   With complaints of right knee pain that was acute in onset when she stood up.  She reports that she felt a pop and has had persistent pain since.  Examination reveals diffuse tenderness but no joint effusion, no significant swelling, no erythema or warmth.  She has no risk factors for septic arthritis and exam does not support diagnosis.  She does have retained range of  motion.  She has an intact patella tendon.  Will treat with analgesia, rest.  Patient given a work note.  Referral to orthopedics.        Final Clinical Impression(s) / ED Diagnoses Final diagnoses:  Acute pain of right knee    Rx / DC Orders ED Discharge Orders          Ordered    ibuprofen (ADVIL) 800 MG tablet  Every 6 hours PRN        07/16/22 0134              Orpah Greek, MD 07/16/22 [phone]

## 2022-08-11 ENCOUNTER — Ambulatory Visit
Admission: EM | Admit: 2022-08-11 | Discharge: 2022-08-11 | Disposition: A | Discharge status: Home / Self Care | Payer: Medicaid Other

## 2022-08-11 DIAGNOSIS — Z1152 Encounter for screening for COVID-19: Secondary | ICD-10-CM | POA: Insufficient documentation

## 2022-08-11 DIAGNOSIS — J069 Acute upper respiratory infection, unspecified: Secondary | ICD-10-CM | POA: Insufficient documentation

## 2022-08-11 NOTE — ED Provider Notes (Signed)
[facility]    CSN: [id number] Arrival date & time: 08/11/22  1613      History   Chief Complaint Chief Complaint  Patient presents with   Cough         HPI Margaret Jennings is a 37 y.o. female.   Patient complains of a cough and congestion.  Patient has fever and bodyaches.  Patient reports she has had drainage from both eyes.  Patient reports symptoms began on Sunday.  Patient has been taking over-the-counter medications without relief  The history is provided by the patient. No language interpreter was used.  Cough Cough characteristics:  Non-productive Sputum characteristics:  Nondescript Severity:  Moderate Onset quality:  Gradual Relieved by:  Nothing Worsened by:  Nothing   Past Medical History:  Diagnosis Date   Asthma    Laryngitis     There are no problems to display for this patient.   Past Surgical History:  Procedure Laterality Date   CESAREAN SECTION  [phone]   TUBAL LIGATION      OB History   No obstetric history on file.      Home Medications    Prior to Admission medications   Medication Sig Start Date End Date Taking? Authorizing Provider  dextromethorphan-guaiFENesin (MUCINEX DM) 30-600 MG 12hr tablet Take 1 tablet by mouth 2 (two) times daily.   Yes [provider]  SUMAtriptan (IMITREX) 50 MG tablet Take by mouth. 05/21/22  Yes [provider]  ibuprofen (ADVIL) 800 MG tablet Take 1 tablet (800 mg total) by mouth every 6 (six) hours as needed for moderate pain. 07/16/22   Orpah Greek, MD  medroxyPROGESTERone (DEPO-PROVERA) 150 MG/ML injection Inject 150 mg into the muscle every 3 (three) months. 04/06/19   [provider]    Family History History reviewed. No pertinent family history.  Social History Social History   Tobacco Use   Smoking status: Former   Smokeless tobacco: Never  Substance Use Topics   Alcohol use: Yes    Comment: occ.   Drug use: No     Allergies    Patient has no known allergies.   Review of Systems Review of Systems  Respiratory:  Positive for cough.   All other systems reviewed and are negative.    Physical Exam Triage Vital Signs ED Triage Vitals  Enc Vitals Group     BP 08/11/22 1717 (!) 141/82     Pulse Rate 08/11/22 1717 79     Resp 08/11/22 1717 18     Temp 08/11/22 1717 98.8 F (37.1 C)     Temp Source 08/11/22 1717 Oral     SpO2 08/11/22 1717 97 %     Weight --      Height --      Head Circumference --      Peak Flow --      Pain Score 08/11/22 1718 5     Pain Loc --      Pain Edu? --      Excl. in [place]? --    No data found.  Updated Vital Signs BP (!) 141/82 (BP Location: Right Arm)   Pulse 79   Temp 98.8 F (37.1 C) (Oral)   Resp 18   SpO2 97%   Visual Acuity Right Eye Distance:   Left Eye Distance:   Bilateral Distance:    Right Eye Near:   Left Eye Near:    Bilateral Near:     Physical Exam Vitals  and nursing note reviewed.  Constitutional:      Appearance: She is well-developed.  HENT:     Head: Normocephalic.  Cardiovascular:     Rate and Rhythm: Normal rate.  Pulmonary:     Effort: Pulmonary effort is normal.  Abdominal:     General: There is no distension.  Musculoskeletal:        General: Normal range of motion.     Cervical back: Normal range of motion.  Neurological:     General: No focal deficit present.     Mental Status: She is alert and oriented to person, place, and time.  Psychiatric:        Mood and Affect: Mood normal.      UC Treatments / Results  Labs (all labs ordered are listed, but only abnormal results are displayed) Labs Reviewed  SARS CORONAVIRUS 2 (TAT 6-24 HRS)    EKG   Radiology No results found.  Procedures Procedures (including critical care time)  Medications Ordered in UC Medications - No data to display  Initial Impression / Assessment and Plan / UC Course  I have reviewed the triage vital signs and the nursing  notes.  Pertinent labs & imaging results that were available during my care of the patient were reviewed by me and considered in my medical decision making (see chart for details).     MDM: COVID test is ordered and is pending patient counseled on symptomatic care I suspect she has influenza or COVID. Final Clinical Impressions(s) / UC Diagnoses   Final diagnoses:  Upper respiratory tract infection, unspecified type     Discharge Instructions      Your symptoms are consistent with a viral respiratory infection she is most likely COVID or influenza.  Your COVID test is pending and should return in the next 24 hours.  Take Tylenol as needed for fever.  Drink plenty of fluids you should not return to work for minimum of 3 days.   ED Prescriptions   None    PDMP not reviewed this encounter. An After Visit Summary was printed and given to the patient.    Fransico Meadow, [place] 08/11/22 6568

## 2022-08-11 NOTE — ED Triage Notes (Signed)
Pt reports cough, headache, sneezing, drainage in ears, body aches, ear pain, fatigue, eye watery, eyes discharge and congestion x 2 days. Mucinex gives no relief.

## 2022-08-11 NOTE — Discharge Instructions (Signed)
Your symptoms are consistent with a viral respiratory infection she is most likely COVID or influenza.  Your COVID test is pending and should return in the next 24 hours.  Take Tylenol as needed for fever.  Drink plenty of fluids you should not return to work for minimum of 3 days.

## 2022-08-12 LAB — SARS CORONAVIRUS 2 (TAT 6-24 HRS): SARS Coronavirus 2: NEGATIVE

## 2023-04-20 ENCOUNTER — Encounter (HOSPITAL_COMMUNITY): Payer: Self-pay | Admitting: Emergency Medicine

## 2023-04-20 ENCOUNTER — Other Ambulatory Visit: Payer: Self-pay

## 2023-04-20 ENCOUNTER — Emergency Department (HOSPITAL_COMMUNITY)
Admission: EM | Admit: 2023-04-20 | Discharge: 2023-04-20 | Disposition: A | Discharge status: Home / Self Care | Payer: Medicaid Other | Attending: Emergency Medicine | Admitting: Emergency Medicine

## 2023-04-20 DIAGNOSIS — K0889 Other specified disorders of teeth and supporting structures: Secondary | ICD-10-CM | POA: Diagnosis present

## 2023-04-20 DIAGNOSIS — J45909 Unspecified asthma, uncomplicated: Secondary | ICD-10-CM | POA: Insufficient documentation

## 2023-04-20 MED ORDER — IBUPROFEN 800 MG PO TABS: 800.0000 mg | ORAL_TABLET | Freq: Four times a day (QID) | ORAL | 0 refills | Status: DC | PRN

## 2023-04-20 MED ORDER — AMOXICILLIN 500 MG PO CAPS: 500.0000 mg | ORAL_CAPSULE | Freq: Three times a day (TID) | ORAL | 0 refills | Status: AC

## 2023-04-20 MED ORDER — IBUPROFEN 400 MG PO TABS
600.0000 mg | ORAL_TABLET | Freq: Once | ORAL | Status: AC
  Administered 2023-04-20: 600 mg via ORAL
  Filled 2023-04-20: qty 2

## 2023-04-20 NOTE — Discharge Instructions (Addendum)
We evaluated you for your dental pain.  You may have a developing dental infection.  Please follow-up with a dentist.  We have prescribed you antibiotics.  Please take Tylenol and Motrin for your symptoms at home.  You can take 1000 mg of Tylenol every 6 hours and 800 mg of ibuprofen every 6 hours as needed for your symptoms.  You can take these medicines together as needed, either at the same time, or alternating every 3 hours.  Please return if you have any new or worsening symptoms such as facial swelling, difficulty swallowing, difficulty breathing, or any other concerning symptoms.

## 2023-04-20 NOTE — ED Triage Notes (Signed)
Pt complains of dental abscess and requesting antibiotic. Pain started 2 days ago. Unable to eat.

## 2023-04-20 NOTE — ED Provider Notes (Signed)
[place] EMERGENCY DEPARTMENT AT [facility] Provider Note  CSN: [id number] Arrival date & time: 04/20/23 1746  Chief Complaint(s) Abscess and Dental Pain  HPI Margaret Jennings is a 37 y.o. female no significant past medical history presenting to the emergency department with dental pain.  She reports 2 days of right upper dental pain.  Reports mild associated pain with moving her jaw.  Has been told she may be needs a tooth extracted.  No fevers or chills.  No trouble swallowing or breathing.   Past Medical History Past Medical History:  Diagnosis Date   Asthma    Laryngitis    There are no problems to display for this patient.  Home Medication(s) Prior to Admission medications   Medication Sig Start Date End Date Taking? Authorizing Provider  amoxicillin (AMOXIL) 500 MG capsule Take 1 capsule (500 mg total) by mouth 3 (three) times daily. 04/20/23  Yes Lonell Grandchild, MD  dextromethorphan-guaiFENesin (MUCINEX DM) 30-600 MG 12hr tablet Take 1 tablet by mouth 2 (two) times daily.    [provider]  ibuprofen (ADVIL) 800 MG tablet Take 1 tablet (800 mg total) by mouth every 6 (six) hours as needed for moderate pain. 04/20/23   Lonell Grandchild, MD  medroxyPROGESTERone (DEPO-PROVERA) 150 MG/ML injection Inject 150 mg into the muscle every 3 (three) months. 04/06/19   [provider]  SUMAtriptan (IMITREX) 50 MG tablet Take by mouth. 05/21/22   [provider]                                                                                                                                    Past Surgical History Past Surgical History:  Procedure Laterality Date   CESAREAN SECTION  04,09,14   TUBAL LIGATION     Family History History reviewed. No pertinent family history.  Social History Social History   Tobacco Use   Smoking status: Former   Smokeless tobacco: Never  Substance Use Topics   Alcohol use: Yes    Comment: occ.    Drug use: No   Allergies Patient has no known allergies.  Review of Systems Review of Systems  All other systems reviewed and are negative.   Physical Exam Vital Signs  I have reviewed the triage vital signs BP (!) 157/98   Pulse 81   Temp 98.4 F (36.9 C) (Oral)   Resp 17   Ht 5\' 10"  (1.778 m)   Wt 106.6 kg   SpO2 99%   BMI 33.72 kg/m  Physical Exam Vitals and nursing note reviewed.  Constitutional:      Appearance: Normal appearance.  HENT:     Head: Normocephalic and atraumatic.     Mouth/Throat:     Mouth: Mucous membranes are moist.     Comments: Floor of mouth soft.  Focal tenderness to percussion over the right upper jaw molar.  No facial swelling.Marland Kitchen  Uvula  midline. Eyes:     Conjunctiva/sclera: Conjunctivae normal.  Cardiovascular:     Rate and Rhythm: Normal rate.  Pulmonary:     Effort: Pulmonary effort is normal. No respiratory distress.  Abdominal:     General: Abdomen is flat.  Musculoskeletal:        General: No deformity.  Skin:    General: Skin is warm and dry.     Capillary Refill: Capillary refill takes less than 2 seconds.  Neurological:     General: No focal deficit present.     Mental Status: She is alert. Mental status is at baseline.  Psychiatric:        Mood and Affect: Mood normal.        Behavior: Behavior normal.     ED Results and Treatments Labs (all labs ordered are listed, but only abnormal results are displayed) Labs Reviewed - No data to display                                                                                                                        Radiology No results found.  Pertinent labs & imaging results that were available during my care of the patient were reviewed by me and considered in my medical decision making (see MDM for details).  Medications Ordered in ED Medications  ibuprofen (ADVIL) tablet 600 mg (600 mg Oral Given 04/20/23 2221)                                                                                                                                      Procedures Procedures  (including critical care time)  Medical Decision Making / ED Course   MDM:  37 year old female presenting to the emergency department with dental pain.  Patient well-appearing, physical exam with tenderness to percussion of the right upper wisdom tooth which appears to be partially emerging.  No external facial swelling.  Floor of mouth soft.  Uvula midline.  Seems to have localized dental pain.  Suspect pain is primarily due to wisdom tooth but given specific tenderness over this tooth will treat with antibiotics.  Recommended follow-up with primary care doctor.  No evidence of deep space bacterial head neck infection, periapical abscess. Will discharge patient to home. All questions answered. Patient comfortable with plan of discharge. Return precautions discussed with patient and specified on the after visit summary.  Medicines ordered and prescription drug management: Meds ordered this encounter  Medications   ibuprofen (ADVIL) 800 MG tablet    Sig: Take 1 tablet (800 mg total) by mouth every 6 (six) hours as needed for moderate pain.    Dispense:  20 tablet    Refill:  0   amoxicillin (AMOXIL) 500 MG capsule    Sig: Take 1 capsule (500 mg total) by mouth 3 (three) times daily.    Dispense:  21 capsule    Refill:  0   ibuprofen (ADVIL) tablet 600 mg    -I have reviewed the patients home medicines and have made adjustments as needed   Social Determinants of Health:  Diagnosis or treatment significantly limited by social determinants of health: obesity   Reevaluation: After the interventions noted above, I reevaluated the patient and found that their symptoms have improved  Co morbidities that complicate the patient evaluation  Past Medical History:  Diagnosis Date   Asthma    Laryngitis       Dispostion: Disposition decision including need for hospitalization  was considered, and patient discharged from emergency department.    Final Clinical Impression(s) / ED Diagnoses Final diagnoses:  Pain, dental     This chart was dictated using voice recognition software.  Despite best efforts to proofread,  errors can occur which can change the documentation meaning.    Lonell Grandchild, MD 04/20/23 2228

## 2023-06-25 ENCOUNTER — Ambulatory Visit: Payer: No Typology Code available for payment source | Admitting: Orthopedic Surgery

## 2023-07-22 NOTE — Progress Notes (Deleted)
  Intake history:  There were no vitals taken for this visit. There is no height or weight on file to calculate BMI.    WHAT ARE WE SEEING YOU FOR TODAY?   right knee(s)  How long has this bothered you? (DOI?DOS?WS?)  ***  Anticoag.  No  Diabetes No  Heart disease No  Hypertension No  SMOKING HX former smoker   Kidney disease No  Any ALLERGIES ______________________________________________   Treatment:  Have you taken:  Tylenol  {yes/no:20286}  Advil  {yes/no:20286}  Had PT {yes/no:20286}  Had injection {yes/no:20286}  Other  _________________________

## 2023-07-26 ENCOUNTER — Ambulatory Visit: Payer: No Typology Code available for payment source | Admitting: Orthopedic Surgery

## 2023-08-24 ENCOUNTER — Other Ambulatory Visit: Payer: Self-pay | Admitting: Family

## 2023-08-24 DIAGNOSIS — N6325 Unspecified lump in the left breast, overlapping quadrants: Secondary | ICD-10-CM

## 2023-08-24 DIAGNOSIS — N63 Unspecified lump in unspecified breast: Secondary | ICD-10-CM

## 2023-08-24 DIAGNOSIS — N6315 Unspecified lump in the right breast, overlapping quadrants: Secondary | ICD-10-CM

## 2023-09-17 ENCOUNTER — Ambulatory Visit
Admission: RE | Admit: 2023-09-17 | Discharge: 2023-09-17 | Disposition: A | Discharge status: Home / Self Care | Payer: No Typology Code available for payment source | Source: Ambulatory Visit | Attending: Family | Admitting: Family

## 2023-09-17 DIAGNOSIS — N6315 Unspecified lump in the right breast, overlapping quadrants: Secondary | ICD-10-CM | POA: Diagnosis present

## 2023-09-17 DIAGNOSIS — N6325 Unspecified lump in the left breast, overlapping quadrants: Secondary | ICD-10-CM | POA: Insufficient documentation

## 2023-09-17 DIAGNOSIS — N63 Unspecified lump in unspecified breast: Secondary | ICD-10-CM | POA: Insufficient documentation

## 2023-09-27 ENCOUNTER — Ambulatory Visit: Admission: EM | Admit: 2023-09-27 | Discharge: 2023-09-27 | Disposition: A | Discharge status: Home / Self Care

## 2023-09-27 DIAGNOSIS — J4521 Mild intermittent asthma with (acute) exacerbation: Secondary | ICD-10-CM | POA: Diagnosis not present

## 2023-09-27 DIAGNOSIS — J111 Influenza due to unidentified influenza virus with other respiratory manifestations: Secondary | ICD-10-CM

## 2023-09-27 LAB — POC COVID19/FLU A&B COMBO
Covid Antigen, POC: NEGATIVE
Influenza A Antigen, POC: NEGATIVE
Influenza B Antigen, POC: NEGATIVE

## 2023-09-27 MED ORDER — OSELTAMIVIR PHOSPHATE 75 MG PO CAPS: 75.0000 mg | ORAL_CAPSULE | Freq: Two times a day (BID) | ORAL | 0 refills | Status: AC

## 2023-09-27 MED ORDER — PROMETHAZINE-DM 6.25-15 MG/5ML PO SYRP: 5.0000 mL | ORAL_SOLUTION | Freq: Four times a day (QID) | ORAL | 0 refills | Status: AC | PRN

## 2023-09-27 MED ORDER — FLUTICASONE PROPIONATE 50 MCG/ACT NA SUSP: 1.0000 | Freq: Two times a day (BID) | NASAL | 2 refills | Status: AC

## 2023-09-27 MED ORDER — PREDNISONE 20 MG PO TABS: 40.0000 mg | ORAL_TABLET | Freq: Every day | ORAL | 0 refills | Status: AC

## 2023-09-27 NOTE — ED Provider Notes (Signed)
 [facility]    CSN: [id number] Arrival date & time: 09/27/23  0102      History   Chief Complaint No chief complaint on file.   HPI Margaret Jennings is a 38 y.o. female.   Patient presenting today with 2-day history of headache, cough, congestion, chest tightness, wheezing, sore throat, body aches, weakness, fatigue.  Denies chest pain, abdominal pain, vomiting, diarrhea, rashes.  So far not trying thing over-the-counter for symptoms.  History of asthma, states has albuterol inhaler at home but has not used it.  No known sick contacts recently.    Past Medical History:  Diagnosis Date   Asthma    Laryngitis     There are no active problems to display for this patient.   Past Surgical History:  Procedure Laterality Date   CESAREAN SECTION  [id number]   TUBAL LIGATION      OB History   No obstetric history on file.      Home Medications    Prior to Admission medications   Medication Sig Start Date End Date Taking? Authorizing Provider  amLODipine (NORVASC) 10 MG tablet Take 10 mg by mouth daily. 08/23/23  Yes [provider]  fluticasone (FLONASE) 50 MCG/ACT nasal spray Place 1 spray into both nostrils 2 (two) times daily. 09/27/23  Yes Particia Nearing, PA-C  oseltamivir (TAMIFLU) 75 MG capsule Take 1 capsule (75 mg total) by mouth every 12 (twelve) hours. 09/27/23  Yes Particia Nearing, PA-C  predniSONE (DELTASONE) 20 MG tablet Take 2 tablets (40 mg total) by mouth daily with breakfast. 09/27/23  Yes Particia Nearing, PA-C  promethazine-dextromethorphan (PROMETHAZINE-DM) 6.25-15 MG/5ML syrup Take 5 mLs by mouth 4 (four) times daily as needed. 09/27/23  Yes Particia Nearing, PA-C  amoxicillin (AMOXIL) 500 MG capsule Take 1 capsule (500 mg total) by mouth 3 (three) times daily. 04/20/23   Lonell Grandchild, MD  dextromethorphan-guaiFENesin (MUCINEX DM) 30-600 MG 12hr tablet Take 1 tablet by mouth 2 (two) times daily.     [provider]  ibuprofen (ADVIL) 800 MG tablet Take 1 tablet (800 mg total) by mouth every 6 (six) hours as needed for moderate pain. 04/20/23   Lonell Grandchild, MD  medroxyPROGESTERone (DEPO-PROVERA) 150 MG/ML injection Inject 150 mg into the muscle every 3 (three) months. 04/06/19   [provider]  SUMAtriptan (IMITREX) 50 MG tablet Take by mouth. 05/21/22   [provider]    Family History Family History  Problem Relation Age of Onset   Breast cancer Sister 69    Social History Social History   Tobacco Use   Smoking status: Former   Smokeless tobacco: Never  Substance Use Topics   Alcohol use: Yes    Comment: occ.   Drug use: No     Allergies   Patient has no known allergies.   Review of Systems Review of Systems Per HPI  Physical Exam Triage Vital Signs ED Triage Vitals  Encounter Vitals Group     BP 09/27/23 0923 (!) 156/99     Systolic BP Percentile --      Diastolic BP Percentile --      Pulse Rate 09/27/23 0923 82     Resp 09/27/23 0923 18     Temp 09/27/23 0923 98.5 F (36.9 C)     Temp Source 09/27/23 0923 Oral     SpO2 09/27/23 0923 97 %     Weight --      Height --  Head Circumference --      Peak Flow --      Pain Score 09/27/23 0925 10     Pain Loc --      Pain Education --      Exclude from Growth Chart --    No data found.  Updated Vital Signs BP (!) 156/99 (BP Location: Right Arm)   Pulse 82   Temp 98.5 F (36.9 C) (Oral)   Resp 18   SpO2 97%   Visual Acuity Right Eye Distance:   Left Eye Distance:   Bilateral Distance:    Right Eye Near:   Left Eye Near:    Bilateral Near:     Physical Exam Vitals and nursing note reviewed.  Constitutional:      Appearance: Normal appearance.  HENT:     Head: Atraumatic.     Right Ear: Tympanic membrane and external ear normal.     Left Ear: Tympanic membrane and external ear normal.     Nose: Rhinorrhea present.     Mouth/Throat:     Mouth:  Mucous membranes are moist.     Pharynx: Posterior oropharyngeal erythema present.  Eyes:     Extraocular Movements: Extraocular movements intact.     Conjunctiva/sclera: Conjunctivae normal.  Cardiovascular:     Rate and Rhythm: Normal rate and regular rhythm.     Heart sounds: Normal heart sounds.  Pulmonary:     Effort: Pulmonary effort is normal.     Breath sounds: Wheezing present.  Musculoskeletal:        General: Normal range of motion.     Cervical back: Normal range of motion and neck supple.  Skin:    General: Skin is warm and dry.  Neurological:     Mental Status: She is alert and oriented to person, place, and time.  Psychiatric:        Mood and Affect: Mood normal.        Thought Content: Thought content normal.      UC Treatments / Results  Labs (all labs ordered are listed, but only abnormal results are displayed) Labs Reviewed  POC COVID19/FLU A&B COMBO    EKG   Radiology No results found.  Procedures Procedures (including critical care time)  Medications Ordered in UC Medications - No data to display  Initial Impression / Assessment and Plan / UC Course  I have reviewed the triage vital signs and the nursing notes.  Pertinent labs & imaging results that were available during my care of the patient were reviewed by me and considered in my medical decision making (see chart for details).     Rapid flu and COVID-negative but symptoms consistent with influenza so we will treat with Tamiflu and asthma exacerbation with prednisone, Phenergan DM, Flonase, albuterol inhaler as needed.  Discussed supportive home care and return precautions.  Final Clinical Impressions(s) / UC Diagnoses   Final diagnoses:  Influenza-like illness  Mild intermittent asthma with acute exacerbation   Discharge Instructions   None    ED Prescriptions     Medication Sig Dispense Auth. Provider   oseltamivir (TAMIFLU) 75 MG capsule Take 1 capsule (75 mg total) by  mouth every 12 (twelve) hours. 10 capsule Particia Nearing, PA-C   predniSONE (DELTASONE) 20 MG tablet Take 2 tablets (40 mg total) by mouth daily with breakfast. 10 tablet Particia Nearing, PA-C   fluticasone [facility]) 50 MCG/ACT nasal spray Place 1 spray into both nostrils 2 (two) times daily. 16  g Particia Nearing, PA-C   promethazine-dextromethorphan (PROMETHAZINE-DM) 6.25-15 MG/5ML syrup Take 5 mLs by mouth 4 (four) times daily as needed. 100 mL Particia Nearing, [place]      PDMP not reviewed this encounter.   Particia Nearing, [place] 09/27/23 1008

## 2023-09-27 NOTE — ED Triage Notes (Signed)
 Pt reports dizziness, headache, cough, congestion, sore throat, chest tightness, body aches, sore, weak, and fatigued x2 days. Thick yellow mucus.

## 2023-12-04 ENCOUNTER — Emergency Department (HOSPITAL_COMMUNITY)
Admission: EM | Admit: 2023-12-04 | Discharge: 2023-12-05 | Disposition: A | Discharge status: Home / Self Care | Attending: Emergency Medicine | Admitting: Emergency Medicine

## 2023-12-04 ENCOUNTER — Encounter (HOSPITAL_COMMUNITY): Payer: Self-pay

## 2023-12-04 DIAGNOSIS — R1084 Generalized abdominal pain: Secondary | ICD-10-CM | POA: Diagnosis present

## 2023-12-04 NOTE — ED Triage Notes (Signed)
 Pt comes in for  abd. And groin pain started today. Pain radiates now to her back. Pt has been urinating okay and has noticed any difference in BM. Pt has known gastro issues. Pt wonders if it gas. Pt has tried tums but no relief.  Pt has hx of high BP. Pt is A&Ox4.

## 2023-12-05 ENCOUNTER — Other Ambulatory Visit: Payer: Self-pay

## 2023-12-05 LAB — CBC
HCT: 39.5 % (ref 36.0–46.0)
Hemoglobin: 12.7 g/dL (ref 12.0–15.0)
MCH: 27.5 pg (ref 26.0–34.0)
MCHC: 32.2 g/dL (ref 30.0–36.0)
MCV: 85.5 fL (ref 80.0–100.0)
Platelets: 234 10*3/uL (ref 150–400)
RBC: 4.62 MIL/uL (ref 3.87–5.11)
RDW: 13.3 % (ref 11.5–15.5)
WBC: 10.2 10*3/uL (ref 4.0–10.5)
nRBC: 0 % (ref 0.0–0.2)

## 2023-12-05 LAB — COMPREHENSIVE METABOLIC PANEL WITH GFR
ALT: 15 U/L (ref 0–44)
AST: 15 U/L (ref 15–41)
Albumin: 3.5 g/dL (ref 3.5–5.0)
Alkaline Phosphatase: 79 U/L (ref 38–126)
Anion gap: 4 — ABNORMAL LOW (ref 5–15)
BUN: 14 mg/dL (ref 6–20)
CO2: 23 mmol/L (ref 22–32)
Calcium: 8.8 mg/dL — ABNORMAL LOW (ref 8.9–10.3)
Chloride: 110 mmol/L (ref 98–111)
Creatinine, Ser: 1.19 mg/dL — ABNORMAL HIGH (ref 0.44–1.00)
GFR, Estimated: 60 mL/min — ABNORMAL LOW (ref >60)
Glucose, Bld: 104 mg/dL — ABNORMAL HIGH (ref 70–99)
Potassium: 3.6 mmol/L (ref 3.5–5.1)
Sodium: 137 mmol/L (ref 135–145)
Total Bilirubin: 0.3 mg/dL (ref 0.0–1.2)
Total Protein: 7.5 g/dL (ref 6.5–8.1)

## 2023-12-05 LAB — LIPASE, BLOOD: Lipase: 35 U/L (ref 11–51)

## 2023-12-05 MED ORDER — DICYCLOMINE HCL 20 MG PO TABS: 20.0000 mg | ORAL_TABLET | Freq: Two times a day (BID) | ORAL | 0 refills | Status: AC | PRN

## 2023-12-05 NOTE — ED Provider Notes (Signed)
 [place]  EMERGENCY DEPARTMENT AT [facility]  Provider Note  CSN: [id number] Arrival date & time: 12/04/23 2257  History Chief Complaint  Patient presents with   Abdominal Pain    Margaret Jennings is a 38 y.o. female here with several days of diffuse, intermittent cramping abdominal pain. Some radiation into her back. She thought it might be gas and took some TUMS without relief. She has not had fever, vomiting, diarrhea, constipation or dysuria. She is not concerned for pregnancy. She reports pain has improved since arrival here and she was able to get some sleep while waiting to be seen.    Home Medications Prior to Admission medications   Medication Sig Start Date End Date Taking? Authorizing Provider  dicyclomine (BENTYL) 20 MG tablet Take 1 tablet (20 mg total) by mouth 2 (two) times daily as needed (abdominal cramps). 12/05/23  Yes Charmayne Cooper, MD  amLODipine (NORVASC) 10 MG tablet Take 10 mg by mouth daily. 08/23/23   [provider]  amoxicillin  (AMOXIL ) 500 MG capsule Take 1 capsule (500 mg total) by mouth 3 (three) times daily. 04/20/23   Mordecai Applebaum, MD  dextromethorphan-guaiFENesin (MUCINEX DM) 30-600 MG 12hr tablet Take 1 tablet by mouth 2 (two) times daily.    [provider]  fluticasone  (FLONASE ) 50 MCG/ACT nasal spray Place 1 spray into both nostrils 2 (two) times daily. 09/27/23   Corbin Dess, PA-C  ibuprofen  (ADVIL ) 800 MG tablet Take 1 tablet (800 mg total) by mouth every 6 (six) hours as needed for moderate pain. 04/20/23   Mordecai Applebaum, MD  medroxyPROGESTERone (DEPO-PROVERA) 150 MG/ML injection Inject 150 mg into the muscle every 3 (three) months. 04/06/19   [provider]  oseltamivir  (TAMIFLU ) 75 MG capsule Take 1 capsule (75 mg total) by mouth every 12 (twelve) hours. 09/27/23   Corbin Dess, PA-C  predniSONE  (DELTASONE ) 20 MG tablet Take 2 tablets (40 mg total) by mouth daily with  breakfast. 09/27/23   Corbin Dess, PA-C  promethazine -dextromethorphan (PROMETHAZINE -DM) 6.25-15 MG/5ML syrup Take 5 mLs by mouth 4 (four) times daily as needed. 09/27/23   Corbin Dess, PA-C  SUMAtriptan (IMITREX) 50 MG tablet Take by mouth. 05/21/22   [provider]     Allergies    Patient has no known allergies.   Review of Systems   Review of Systems Please see HPI for pertinent positives and negatives  Physical Exam BP (!) 157/97 (BP Location: Right Arm)   Pulse 82   Temp (!) 97.1 F (36.2 C) (Temporal)   Resp 18   Ht 6' (1.829 m)   Wt 104.3 kg   SpO2 99%   BMI 31.19 kg/m   Physical Exam Vitals and nursing note reviewed.  Constitutional:      Appearance: Normal appearance.  HENT:     Head: Normocephalic and atraumatic.     Nose: Nose normal.     Mouth/Throat:     Mouth: Mucous membranes are moist.  Eyes:     Extraocular Movements: Extraocular movements intact.     Conjunctiva/sclera: Conjunctivae normal.  Cardiovascular:     Rate and Rhythm: Normal rate.  Pulmonary:     Effort: Pulmonary effort is normal.     Breath sounds: Normal breath sounds.  Abdominal:     General: Abdomen is flat.     Palpations: Abdomen is soft.     Tenderness: There is no abdominal tenderness. There is no guarding. Negative signs include  Murphy's sign and McBurney's sign.  Musculoskeletal:        General: No swelling. Normal range of motion.     Cervical back: Neck supple.  Skin:    General: Skin is warm and dry.  Neurological:     General: No focal deficit present.     Mental Status: She is alert.  Psychiatric:        Mood and Affect: Mood normal.     ED Results / Procedures / Treatments   EKG None  Procedures Procedures  Medications Ordered in the ED Medications - No data to display  Initial Impression and Plan  Patient here with intermittent abdominal pain. Currently pain free with a benign abdomen. No other concerning symptoms. No GU  symptoms. Labs done in triage show unremarkable CBC, CMP and lipase. Given benign exam and resolution of pain, doubt imaging will be beneficial. Plan discharge with Rx for bentyl for pain, recommend bland diet, increased fiber and water. PCP follow up, RTED for any other concerns.    ED Course       MDM Rules/Calculators/A&P Medical Decision Making Problems Addressed: Generalized abdominal pain: acute illness or injury  Amount and/or Complexity of Data Reviewed Labs: ordered. Decision-making details documented in ED Course.  Risk Prescription drug management.     Final Clinical Impression(s) / ED Diagnoses Final diagnoses:  Generalized abdominal pain    Rx / DC Orders ED Discharge Orders          Ordered    dicyclomine (BENTYL) 20 MG tablet  2 times daily PRN        12/05/23 0150             Charmayne Cooper, MD 12/05/23 [phone]

## 2024-05-15 ENCOUNTER — Encounter (HOSPITAL_COMMUNITY): Payer: Self-pay

## 2024-05-15 ENCOUNTER — Emergency Department (HOSPITAL_COMMUNITY)

## 2024-05-15 ENCOUNTER — Other Ambulatory Visit: Payer: Self-pay

## 2024-05-15 ENCOUNTER — Emergency Department (HOSPITAL_COMMUNITY)
Admission: EM | Admit: 2024-05-15 | Discharge: 2024-05-15 | Disposition: A | Discharge status: Home / Self Care | Attending: Emergency Medicine | Admitting: Emergency Medicine

## 2024-05-15 DIAGNOSIS — J340 Abscess, furuncle and carbuncle of nose: Secondary | ICD-10-CM

## 2024-05-15 DIAGNOSIS — S0121XA Laceration without foreign body of nose, initial encounter: Secondary | ICD-10-CM | POA: Diagnosis not present

## 2024-05-15 DIAGNOSIS — S80912A Unspecified superficial injury of left knee, initial encounter: Secondary | ICD-10-CM | POA: Insufficient documentation

## 2024-05-15 DIAGNOSIS — M25562 Pain in left knee: Secondary | ICD-10-CM

## 2024-05-15 DIAGNOSIS — L03116 Cellulitis of left lower limb: Secondary | ICD-10-CM

## 2024-05-15 MED ORDER — TETANUS-DIPHTH-ACELL PERTUSSIS 5-2-15.5 LF-MCG/0.5 IM SUSP
0.5000 mL | Freq: Once | INTRAMUSCULAR | Status: DC
  Filled 2024-05-15: qty 0.5

## 2024-05-15 MED ORDER — CEPHALEXIN 500 MG PO CAPS
500.0000 mg | ORAL_CAPSULE | Freq: Once | ORAL | Status: AC
  Administered 2024-05-15: 500 mg via ORAL
  Filled 2024-05-15: qty 1

## 2024-05-15 MED ORDER — MUPIROCIN CALCIUM 2 % EX CREA: 1.0000 | TOPICAL_CREAM | Freq: Two times a day (BID) | CUTANEOUS | 0 refills | Status: AC

## 2024-05-15 MED ORDER — CEPHALEXIN 500 MG PO CAPS: 500.0000 mg | ORAL_CAPSULE | Freq: Four times a day (QID) | ORAL | 0 refills | Status: AC

## 2024-05-15 MED ORDER — KETOROLAC TROMETHAMINE 15 MG/ML IJ SOLN
15.0000 mg | Freq: Once | INTRAMUSCULAR | Status: AC
  Administered 2024-05-15: 15 mg via INTRAMUSCULAR
  Filled 2024-05-15: qty 1

## 2024-05-15 MED ORDER — IBUPROFEN 600 MG PO TABS: 600.0000 mg | ORAL_TABLET | Freq: Four times a day (QID) | ORAL | 0 refills | Status: AC | PRN

## 2024-05-15 NOTE — ED Notes (Signed)
 Pt complains of left knee pain 8/10. Toradol  given for pain. Ice pack provided to pt. Pt states pain is too bad to touch knee with ice pack.Tetanus not given due to patient having received tetanus in 2024.  Call bell in reach no other needs requested at this time.

## 2024-05-15 NOTE — Discharge Instructions (Signed)
 Pleasure taking care of you today.  You were seen in the emergency room for left knee pain.  X-ray shows a small effusion in your left knee, but no broken bones, no dislocation.  On exam, you have an abrasion with surrounding redness consistent with a skin infection so we are treating with antibiotics.  Make sure to take the full course.  If you have increased swelling of your knee, worsening pain or develop fever or other worrisome symptoms come back to the ER right away.  You also have an injury to the inside of the left nostril.  There is a small ulceration on exam, use the topical mupirocin cream, follow-up with your PCP and/or ENT.

## 2024-05-15 NOTE — ED Notes (Signed)
 Non-adhesive bandage and ace wrap applied to pt's left knee at this time.
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
